# Patient Record
Sex: Female | Born: 1950 | Race: White | Hispanic: No | Smoking: Never smoker
Health system: Southern US, Community
[De-identification: ages and names within clinical notes are randomized; demographics above are authoritative.]

## PROBLEM LIST (undated history)

## (undated) DIAGNOSIS — IMO0002 Reserved for concepts with insufficient information to code with codable children: Secondary | ICD-10-CM

## (undated) DIAGNOSIS — E785 Hyperlipidemia, unspecified: Secondary | ICD-10-CM

## (undated) DIAGNOSIS — N939 Abnormal uterine and vaginal bleeding, unspecified: Secondary | ICD-10-CM

## (undated) HISTORY — DX: Reserved for concepts with insufficient information to code with codable children: IMO0002

## (undated) HISTORY — DX: Abnormal uterine and vaginal bleeding, unspecified: N93.9

## (undated) HISTORY — DX: Hyperlipidemia, unspecified: E78.5

---

## 1987-03-15 HISTORY — PX: DIAGNOSTIC LAPAROSCOPY: SUR761

## 1999-03-15 HISTORY — PX: ANKLE SURGERY: SHX546

## 2008-08-12 ENCOUNTER — Ambulatory Visit (HOSPITAL_COMMUNITY): Admission: RE | Admit: 2008-08-12 | Discharge: 2008-08-12 | Payer: Self-pay | Admitting: Internal Medicine

## 2009-03-14 DIAGNOSIS — IMO0002 Reserved for concepts with insufficient information to code with codable children: Secondary | ICD-10-CM

## 2009-03-14 HISTORY — DX: Reserved for concepts with insufficient information to code with codable children: IMO0002

## 2009-09-23 ENCOUNTER — Other Ambulatory Visit: Admission: RE | Admit: 2009-09-23 | Discharge: 2009-09-23 | Payer: Self-pay | Admitting: Obstetrics and Gynecology

## 2009-09-23 ENCOUNTER — Ambulatory Visit: Payer: Self-pay | Admitting: Obstetrics and Gynecology

## 2009-10-07 ENCOUNTER — Ambulatory Visit: Payer: Self-pay | Admitting: Obstetrics and Gynecology

## 2009-10-08 ENCOUNTER — Ambulatory Visit: Admission: RE | Admit: 2009-10-08 | Discharge: 2009-10-08 | Payer: Self-pay | Admitting: Gynecologic Oncology

## 2009-10-12 HISTORY — PX: ABDOMINAL HYSTERECTOMY: SHX81

## 2009-10-13 ENCOUNTER — Inpatient Hospital Stay (HOSPITAL_COMMUNITY): Admission: RE | Admit: 2009-10-13 | Discharge: 2009-10-16 | Payer: Self-pay | Admitting: Obstetrics & Gynecology

## 2009-10-13 ENCOUNTER — Encounter: Payer: Self-pay | Admitting: Obstetrics & Gynecology

## 2009-11-19 ENCOUNTER — Ambulatory Visit: Admission: RE | Admit: 2009-11-19 | Discharge: 2009-11-19 | Payer: Self-pay | Admitting: Gynecologic Oncology

## 2010-02-24 ENCOUNTER — Ambulatory Visit: Payer: Self-pay | Admitting: Family Medicine

## 2010-05-27 ENCOUNTER — Ambulatory Visit: Payer: Self-pay | Attending: Gynecologic Oncology | Admitting: Gynecologic Oncology

## 2010-05-27 DIAGNOSIS — C549 Malignant neoplasm of corpus uteri, unspecified: Secondary | ICD-10-CM | POA: Insufficient documentation

## 2010-05-27 DIAGNOSIS — E785 Hyperlipidemia, unspecified: Secondary | ICD-10-CM | POA: Insufficient documentation

## 2010-05-28 LAB — COMPREHENSIVE METABOLIC PANEL
ALT: 13 U/L (ref 0–35)
AST: 20 U/L (ref 0–37)
Albumin: 4.2 g/dL (ref 3.5–5.2)
BUN: 14 mg/dL (ref 6–23)
CO2: 30 mEq/L (ref 19–32)
Calcium: 9.8 mg/dL (ref 8.4–10.5)
Chloride: 104 mEq/L (ref 96–112)
Glucose, Bld: 99 mg/dL (ref 70–99)
Potassium: 4.4 mEq/L (ref 3.5–5.1)
Sodium: 142 mEq/L (ref 135–145)
Total Bilirubin: 0.6 mg/dL (ref 0.3–1.2)
Total Protein: 7.2 g/dL (ref 6.0–8.3)

## 2010-05-28 LAB — ABO/RH: ABO/RH(D): A POS

## 2010-05-28 LAB — CBC
HCT: 35.1 % — ABNORMAL LOW (ref 36.0–46.0)
HCT: 42.3 % (ref 36.0–46.0)
Hemoglobin: 11.9 g/dL — ABNORMAL LOW (ref 12.0–15.0)
MCH: 32.7 pg (ref 26.0–34.0)
MCHC: 34.6 g/dL (ref 30.0–36.0)
RBC: 3.63 MIL/uL — ABNORMAL LOW (ref 3.87–5.11)

## 2010-05-28 LAB — BASIC METABOLIC PANEL
BUN: 9 mg/dL (ref 6–23)
Calcium: 8.5 mg/dL (ref 8.4–10.5)
GFR calc Af Amer: >60 mL/min (ref >60)
GFR calc non Af Amer: >60 mL/min (ref >60)
Potassium: 4.6 mEq/L (ref 3.5–5.1)

## 2010-05-28 LAB — DIFFERENTIAL
Basophils Relative: 1 % (ref 0–1)
Eosinophils Relative: 2 % (ref 0–5)
Lymphocytes Relative: 13 % (ref 12–46)
Monocytes Absolute: 0.6 10*3/uL (ref 0.1–1.0)
Neutro Abs: 5.1 10*3/uL (ref 1.7–7.7)
Neutrophils Relative %: 76 % (ref 43–77)

## 2010-05-28 LAB — SURGICAL PCR SCREEN: Staphylococcus aureus: POSITIVE — AB

## 2010-06-04 NOTE — Consult Note (Signed)
Jennifer [name], KRAMME NO.:  [id number]  MEDICAL RECORD NO.:  [id number]           PATIENT TYPE:  LOCATION:                                 FACILITY:  PHYSICIAN:  Laurette Schimke, MD     DATE OF BIRTH:  [birth year]  DATE OF CONSULTATION:  05/27/2010 DATE OF DISCHARGE:                                CONSULTATION   REASON FOR VISIT:  Uterine cancer surveillance.  HISTORY OF PRESENT ILLNESS:  This is a 60 year old who noted vaginal spotting dating back to April 2007.  She sought evaluation and endometrial biopsy in July 2011, was consistent with a grade 3 endometrioid adenocarcinoma.  On October 13, 2009, she underwent total abdominal hysterectomy and bilateral salpingo-oophorectomy, bilateral pelvic, and periaortic lymph node dissection.  Pathology is consistent with a grade 3 endometrioid adenocarcinoma invading less than 50% of the endometrium, deepest invasion was 7 mm, where the myometrium was 18 mm. There is no lymphovascular space invasion and washings were negative. She is a stage IA, grade 3 endometrioid adenocarcinoma. Postoperatively, Ms. Faerber did well. At this visit she denies nausea, vomiting, fever, chills, shortness of breath, cough, headache, increasing abdominal girth, change in appetite, vaginal bleeding, or changes in bowel or urinary habits or lower extremity changes.  PAST MEDICAL HISTORY: 1. Hyperlipidemia diagnosed in April 2011 2. Stage IA grade 3 endometrial cancer.  PAST SURGICAL HISTORY:  Right ankle surgery, 2001; diagnostic laparoscopy, 1989; ear surgery remotely; total abdominal hysterectomy with bilateral salpingo-oophorectomy; bilateral pelvic and periaortic lymph node dissection, August 2011.  FAMILY HISTORY:  No interval changes.  SOCIAL HISTORY:  She is a Administrator, arts at [employer]. She denies tobacco or alcohol use, and she reports that she has multiple bails.  SCREENING HISTORY:  Mammogram in  2010.  REVIEW OF SYSTEMS:  Ten point review of systems as noted above.  PHYSICAL EXAMINATION:  VITAL SIGNS:  Weight 147.5 pounds, blood pressure 122/68, pulse was 68. GENERAL:  Well-developed female, in no acute distress. CHEST:  Clear to auscultation. HEART:  Regular rate and rhythm. BACK:  No CVA tenderness.  There is no cervical, supraclavicular, or inguinal adenopathy. ABDOMEN:  Soft, nontender.  Midline incision well healed. EXTREMITIES:  No clubbing, cyanosis, or edema. PELVIC:  Normal external genitalia, Bartholin's, urethra, and Skene's. Vagina markedly atrophic, cuff intact.  No visible masses.  No nodularity palpable. RECTAL:  Good anal sphincter tone without any masses.  IMPRESSION:  Ms. Kaminsky has a stage IA grade 3 endometrial cancer with no indication for adjuvant therapy.  PLAN:  Plan is for her to follow up with GYN Clinic at [facility] in 3 months and follow up with GYN/Oncology in 6 months.  Pap test will be required no more than annually and imaging based on symptomatology.  All of her questions were answered.     Laurette Schimke, MD     WB/MEDQ  D:  05/27/2010  T:  05/27/2010  Job:  [id number]  cc:   Clement Husbands, M.D.       Fax: [phone]  Telford Nab, R.N. [address] [place], [place] [address]  Electronically Signed by Laurette Schimke MD on 05/31/2010 10:32:21 AM

## 2010-08-23 ENCOUNTER — Ambulatory Visit (INDEPENDENT_AMBULATORY_CARE_PROVIDER_SITE_OTHER): Payer: Self-pay | Admitting: Obstetrics and Gynecology

## 2010-08-23 ENCOUNTER — Other Ambulatory Visit: Payer: Self-pay | Admitting: Obstetrics and Gynecology

## 2010-08-23 DIAGNOSIS — Z01419 Encounter for gynecological examination (general) (routine) without abnormal findings: Secondary | ICD-10-CM

## 2010-08-23 DIAGNOSIS — Z124 Encounter for screening for malignant neoplasm of cervix: Secondary | ICD-10-CM

## 2010-10-28 ENCOUNTER — Ambulatory Visit (HOSPITAL_COMMUNITY)
Admission: RE | Admit: 2010-10-28 | Discharge: 2010-10-28 | Disposition: A | Discharge status: Home / Self Care | Payer: Self-pay | Source: Ambulatory Visit | Attending: Gynecologic Oncology | Admitting: Gynecologic Oncology

## 2010-10-28 ENCOUNTER — Ambulatory Visit: Payer: Self-pay | Attending: Gynecologic Oncology | Admitting: Gynecologic Oncology

## 2010-10-28 DIAGNOSIS — M7989 Other specified soft tissue disorders: Secondary | ICD-10-CM

## 2010-10-28 DIAGNOSIS — R609 Edema, unspecified: Secondary | ICD-10-CM | POA: Insufficient documentation

## 2010-10-28 DIAGNOSIS — E785 Hyperlipidemia, unspecified: Secondary | ICD-10-CM | POA: Insufficient documentation

## 2010-10-28 DIAGNOSIS — C55 Malignant neoplasm of uterus, part unspecified: Secondary | ICD-10-CM | POA: Insufficient documentation

## 2010-11-02 NOTE — Consult Note (Signed)
Jennifer [name], DOWNS NO.:  [id number]  MEDICAL RECORD NO.:  [id number]  LOCATION:                                 FACILITY:  PHYSICIAN:  Laurette Schimke, MD     DATE OF BIRTH:  Mar 09, 1951  DATE OF CONSULTATION:  10/28/2010 DATE OF DISCHARGE:                                CONSULTATION   REASON FOR VISIT:  Uterine cancer surveillance.  HISTORY OF PRESENT ILLNESS:  This is a 60 year old who was evaluated by Dr. Perlie Gold for abnormal uterine bleeding with an endometrial biopsy in July 2011.  Of note, the patient presented with 4 years of abnormal uterine bleeding with endometrial biopsies consistent of grade 3 endometrioid adenocarcinoma.  On October 13, 2009, she underwent total abdominal hysterectomy, bilateral salpingo-oophorectomy, and bilateral pelvic and periaortic lymph node dissection.  Pathology is notable for grade 3 endometrioid carcinoma invading less than 50% of the myometrium. There was no lymphovascular space invasion as such.  She was a stage IA,grade 3 endometrioid adenocarcinoma.  Jennifer [name] has done well since.  PAST MEDICAL HISTORY:  Hyperlipidemia, diagnosed in April 2011 and stage IA, grade 3 endometrial cancer.  PAST SURGICAL HISTORY:  Right ankle surgery in 2001, diagnostic laparoscopy in 1989, remote history of ear surgery, total abdominal hysterectomy, bilateral salpingo-oophorectomy, and bilateral pelvic and periaortic lymph node dissection in August 2011.  SOCIAL HISTORY:  The patient works at [employer].  Denies tobacco or alcohol use.  REVIEW OF SYSTEMS:  No nausea, vomiting, fever, or chills.  No headache, chest pain, or flank pain.  Denies hematuria, hematochezia, Hemoccult, and hematocolpos.  No diarrhea, constipation, change in appetite, or early satiety.  Reports worsening of left lower extremity edema over the past few months.  Otherwise, 10-point review of systems is negative.  PHYSICAL EXAMINATION:  GENERAL:   Well-developed, thin female, in no acute distress. VITAL SIGNS:  Weight 153 pounds, blood pressure 130/64, respiratory rate of 18, and temperature 98.0. LYMPH NODES: No cervical, supraclavicular, or inguinal adenopathy. CHEST:  Clear to auscultation. HEART:  Regular rate and rhythm. BACK:  No CVA tenderness. ABDOMEN:  Soft, obese, and nontender.  Midline incision noted.  No evidence of a hernia. PELVIC:  Normal external genitalia, Bartholin, urethral and Skene. Atrophic vagina.  No masses appreciated in the vagina or cul-de-sac. RECTAL:  Good anal sphincter tone without any rectovaginal septum nodularity. EXTREMITIES:  3+ left lower extremity edema with associated erythema. No Homans' or palpable cords.  DIAGNOSTIC STUDIES:  Left lower extremity Doppler ultrasound was negative for evidence of deep venous thrombosis.  IMPRESSION:  Jennifer [name] is a 60 year old with a stage IA, grade 3 endometrial cancer.  She is without any evidence of disease.  Of issue is the left lower extremity edema.  There is no evidence of venous thrombus.  At this time, we will refer her to Lymphedema Clinic.  It is possible that this may be a confluence of the pelvic lymph node dissection.  Other possibilities are venous stasis, secondary adenopathy within her pelvis, however, this would be more likely to be associated with bilateral lower extremity edema.  Jennifer [name] has been advised to follow up with Dr. Perlie Gold  in 3 months and with GYN/Oncology service in 6 months.     Laurette Schimke, MD     WB/MEDQ  D:  10/28/2010  T:  10/29/2010  Job:  [id number]  cc:   Clement Husbands, M.D. Fax: [phone]  Telford Nab, R.N. [address] [place], [place] [address]  Horton Chin, MD  Electronically Signed by Laurette Schimke MD on 11/02/2010 07:11:18 AM

## 2011-07-02 IMAGING — CR DG CHEST 2V
2 series · 2 of 2 positions shown · non-contrast
Comparison: None

CLINICAL DATA: Preoperative respiratory examination for endometrial
cancer.

CHEST - 2 VIEW

[w chest pa]
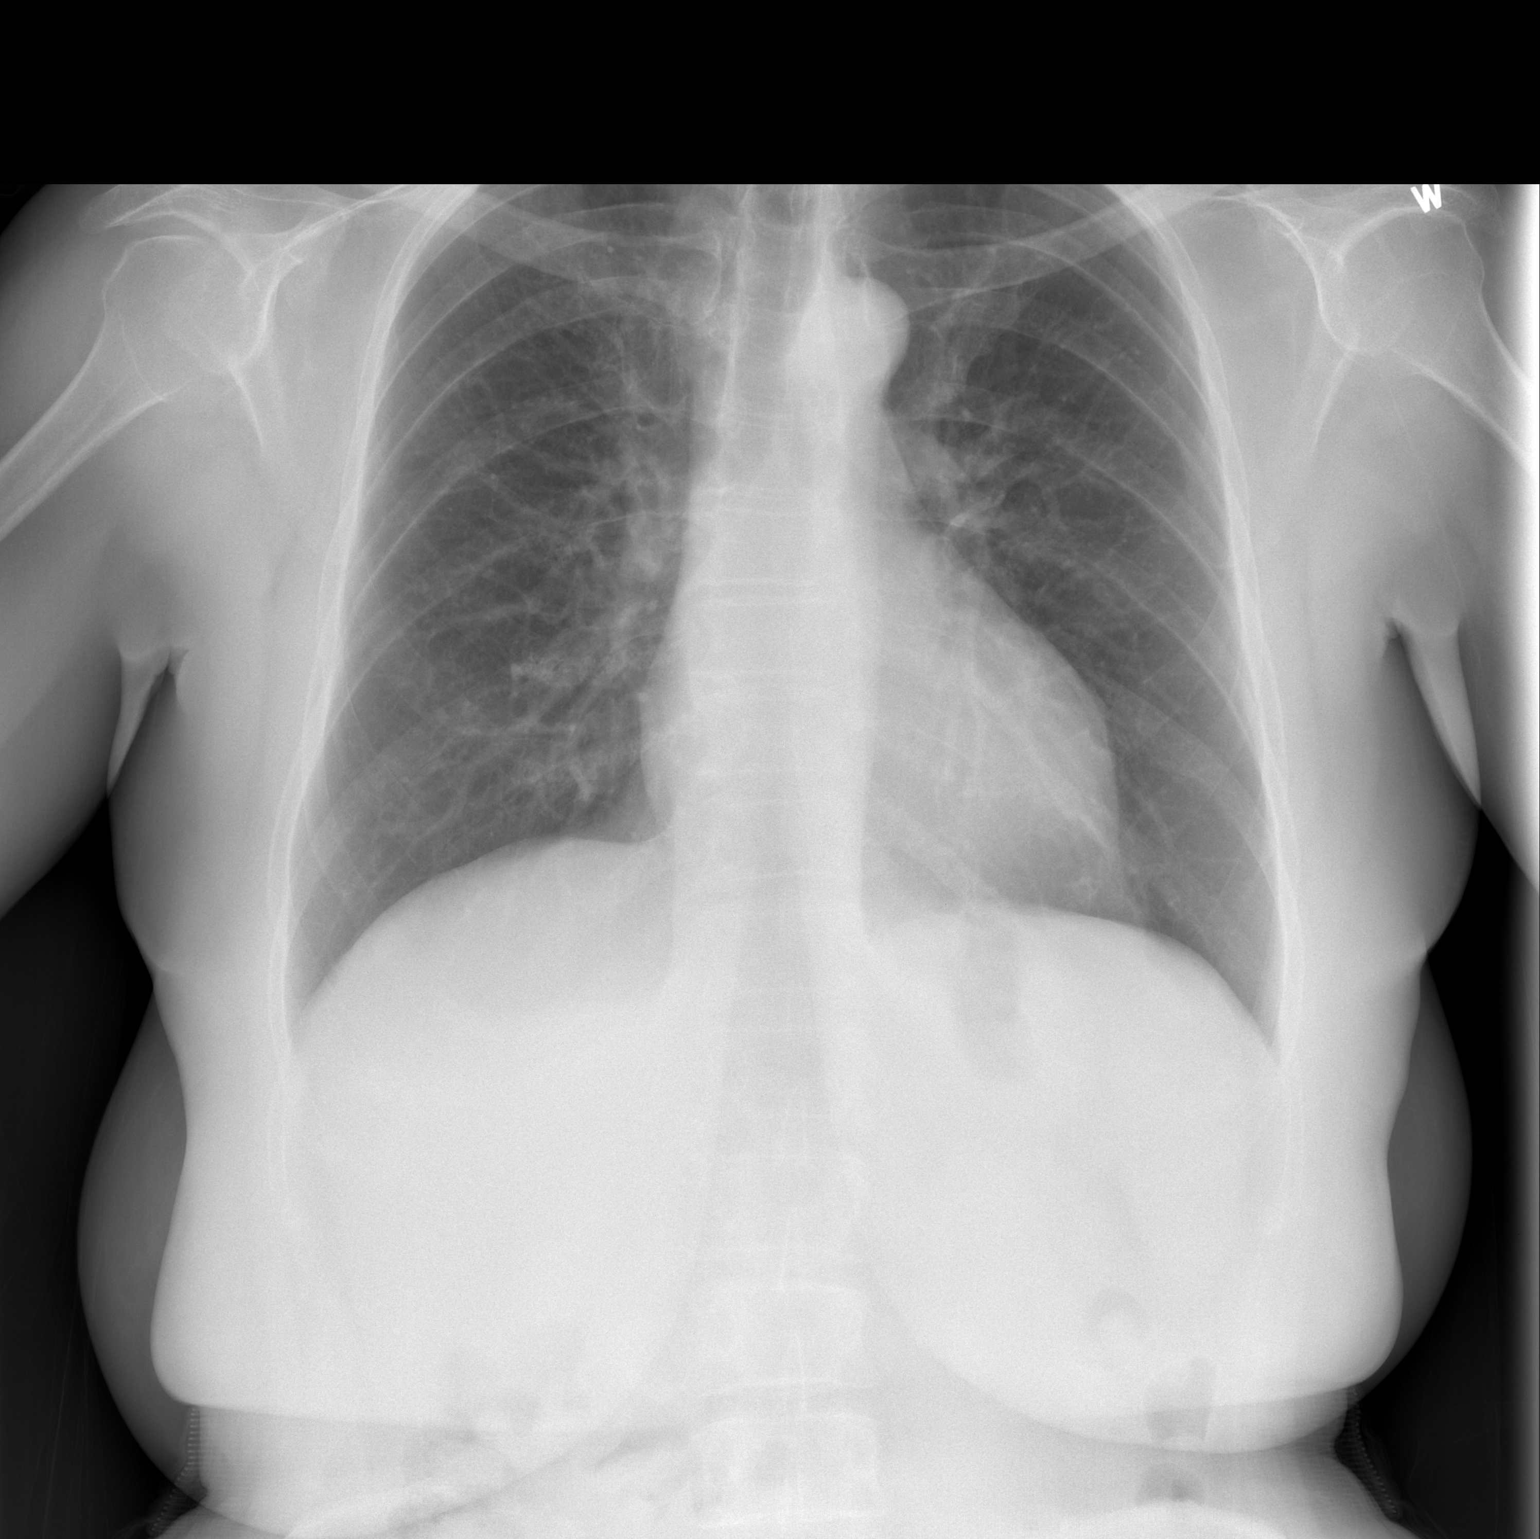

[w chest lat]
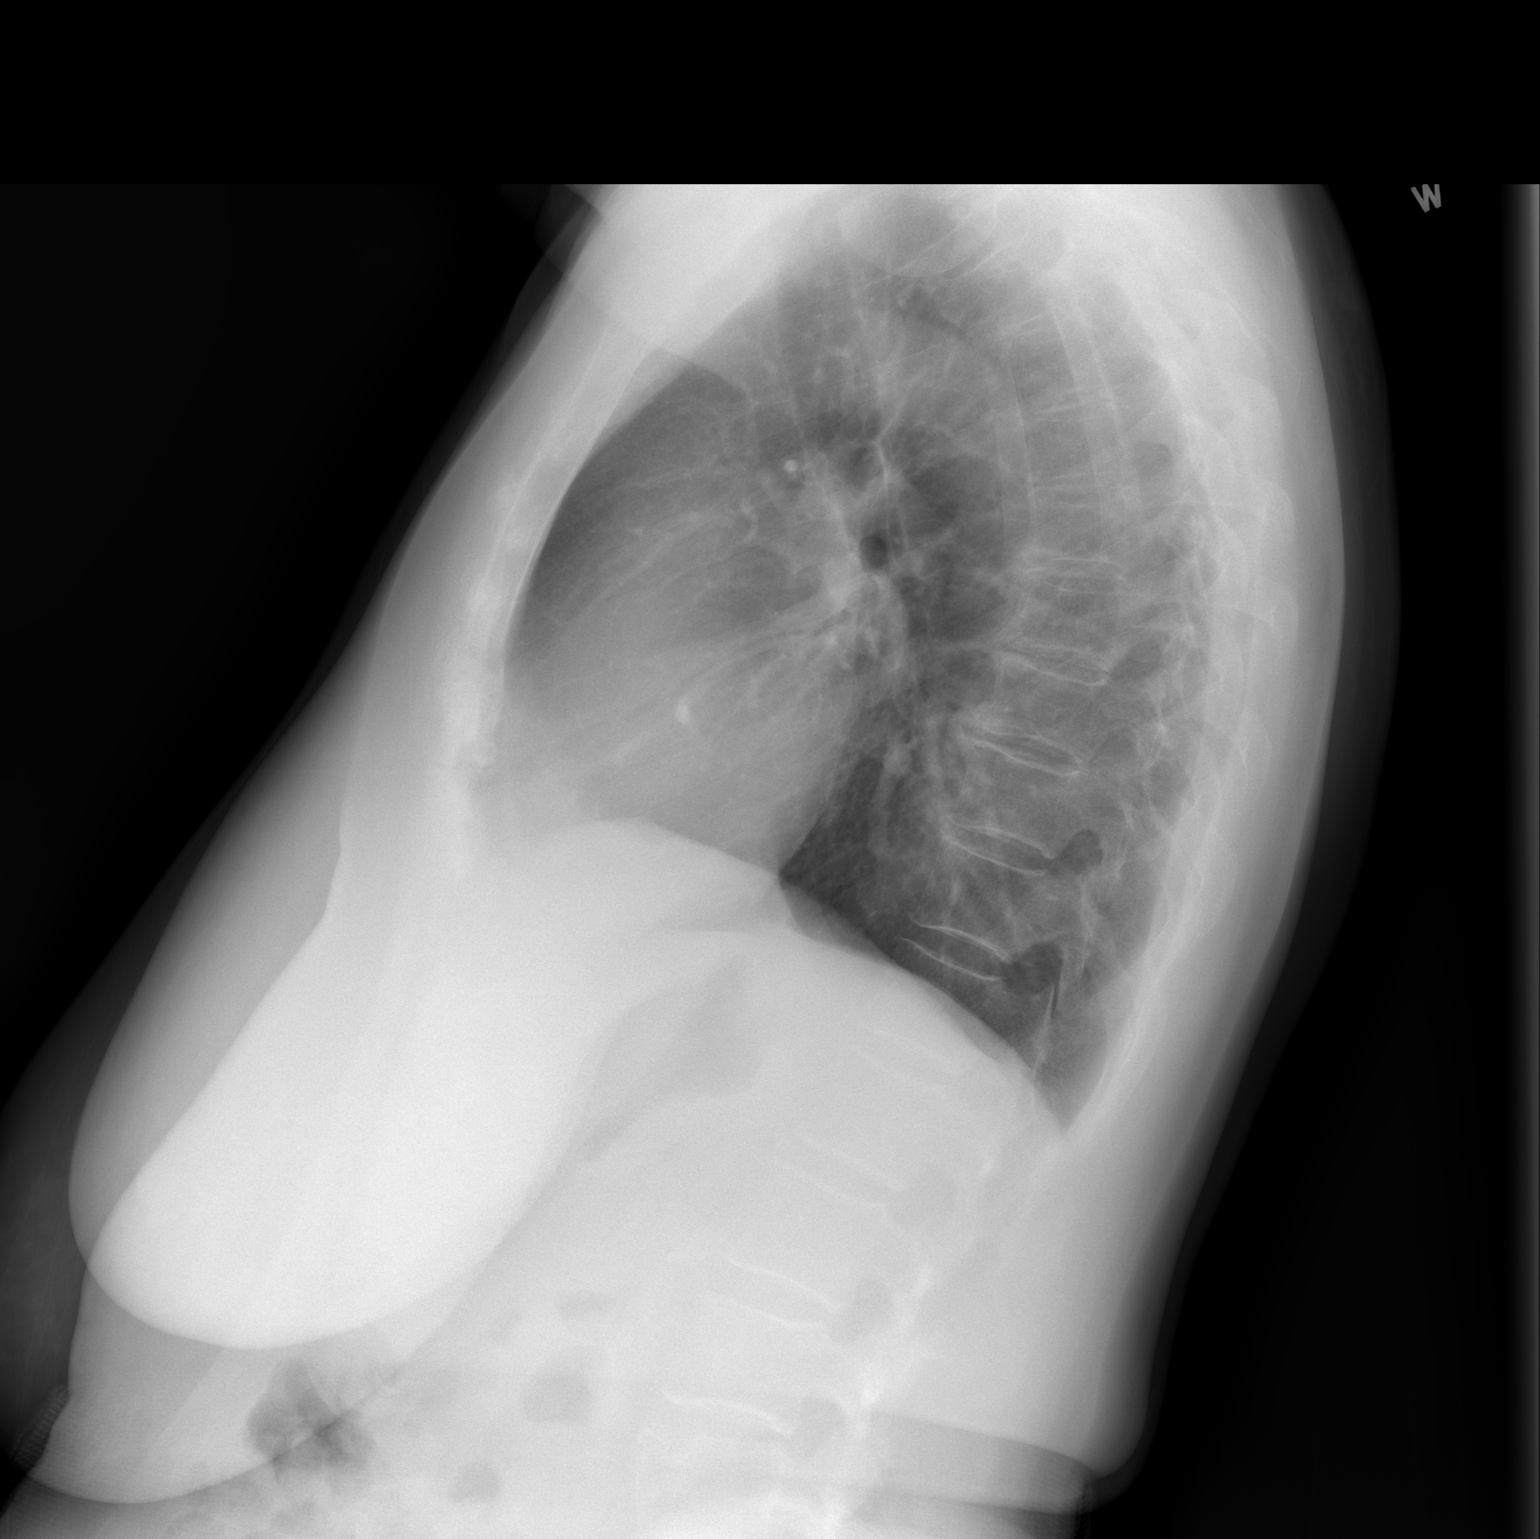

[2 of 2 positions shown; findings below may reference images not displayed]

FINDINGS: The cardiomediastinal silhouette is unremarkable.
The lungs are clear.
There is no evidence of focal airspace disease, pulmonary edema,
pleural effusion, or pneumothorax.
No acute bony abnormalities are identified.
IMPRESSION: No evidence of active cardiopulmonary disease.

## 2011-11-16 ENCOUNTER — Encounter: Payer: Self-pay | Admitting: Gynecologic Oncology

## 2011-11-17 ENCOUNTER — Encounter: Payer: Self-pay | Admitting: Gynecologic Oncology

## 2011-11-17 ENCOUNTER — Other Ambulatory Visit (HOSPITAL_COMMUNITY)
Admission: RE | Admit: 2011-11-17 | Discharge: 2011-11-17 | Disposition: A | Discharge status: Home / Self Care | Payer: Self-pay | Source: Ambulatory Visit | Attending: Gynecologic Oncology | Admitting: Gynecologic Oncology

## 2011-11-17 ENCOUNTER — Ambulatory Visit: Payer: Self-pay | Attending: Gynecologic Oncology | Admitting: Gynecologic Oncology

## 2011-11-17 VITALS — BP 120/80 | HR 66 | Temp 97.6°F | Resp 16 | Ht 61.81 in | Wt 156.4 lb

## 2011-11-17 DIAGNOSIS — C541 Malignant neoplasm of endometrium: Secondary | ICD-10-CM

## 2011-11-17 DIAGNOSIS — Z01419 Encounter for gynecological examination (general) (routine) without abnormal findings: Secondary | ICD-10-CM | POA: Insufficient documentation

## 2011-11-17 DIAGNOSIS — C55 Malignant neoplasm of uterus, part unspecified: Secondary | ICD-10-CM

## 2011-11-17 DIAGNOSIS — C549 Malignant neoplasm of corpus uteri, unspecified: Secondary | ICD-10-CM | POA: Insufficient documentation

## 2011-11-17 DIAGNOSIS — R609 Edema, unspecified: Secondary | ICD-10-CM | POA: Insufficient documentation

## 2011-11-17 NOTE — Patient Instructions (Addendum)
follow up with Gyn Onc in 3 months.  Pap test collected today  Recommend mammography and colonoscopy.

## 2011-11-17 NOTE — Progress Notes (Signed)
REASON FOR VISIT: Uterine cancer surveillance.   HISTORY OF PRESENT ILLNESS: This is a 61 year old who was evaluated by Dr. Perlie Gold for abnormal uterine bleeding with an endometrial biopsy in July 2011. Of note, the patient presented with 4 years of abnormal uterine bleeding with endometrial biopsies consistent of grade 3  endometrioid adenocarcinoma. On October 13, 2009,Dr. Clarke-Pearson performed a  total abdominal hysterectomy, bilateral salpingo-oophorectomy, and bilateral pelvic and periaortic lymph node dissection. Pathology is notable for grade 3 endometrioid carcinoma invading less than 50% of the myometrium.  There was no lymphovascular space invasion as such. She was a stage IA,grade 3 endometrioid adenocarcinoma. Jennifer [name] has done well since.   PAST MEDICAL HISTORY: Hyperlipidemia, diagnosed in April 2011 and stage  IA, grade 3 endometrial cancer.   PAST SURGICAL HISTORY: Right ankle surgery in 2001, diagnostic  laparoscopy in 1989, remote history of ear surgery, total abdominal  hysterectomy, bilateral salpingo-oophorectomy, and bilateral pelvic and  periaortic lymph node dissection in August 2011.   SOCIAL HISTORY: The patient works at [employer]. Denies tobacco  or alcohol use.    REVIEW OF SYSTEMS: No nausea, vomiting, fever, or chills. No headache,  chest pain, or flank pain. Denies hematuria, hematochezia, Hemoccult,  and hematocolpos. No diarrhea, constipation, change in appetite, or  early satiety. Reports stable  left lower extremity edemaOtherwise, 10-point review of systems is negative.   PHYSICAL EXAMINATION: GENERAL: Well-developed, thin female, in no  acute distress.  VITAL SIGNS:BP 120/80  Pulse 66  Temp 97.6 F (36.4 C) (Oral)  Resp 16  Ht 5' 1.81" (1.57 m)  Wt 156 lb 6.4 oz (70.943 kg)  BMI 28.78 kg/m2 LYMPH NODES: No cervical, supraclavicular, or inguinal adenopathy.  CHEST: Clear to auscultation.  HEART: Regular rate and rhythm.  BACK: No CVA  tenderness.  ABDOMEN: Soft, obese, and nontender. Midline incision noted. No  evidence of a hernia.  PELVIC: Normal external genitalia, Bartholin, urethral and Skene.  Atrophic vagina. No masses appreciated in the vagina or cul-de-sac.  RECTAL: Good anal sphincter tone without any rectovaginal septum  nodularity.  EXTREMITIES: 3+ left lower extremity edema with associated erythema.  No Homans' or palpable cords. Right LE wnl  IMPRESSION: Jennifer [name] is a 61 year old with a stage IA, grade 3  endometrial cancer. She is without any evidence of disease.The patient declined referral  to Lymphedema Clinic.The edema is stable and resolves with elevation of the LLE.     Jennifer [name] has been advised to follow up with Gyn Onc in 3 months.  Pap test collected today Recommend mammography and colonoscopy.

## 2011-11-30 ENCOUNTER — Telehealth: Payer: Self-pay | Admitting: *Deleted

## 2011-11-30 NOTE — Telephone Encounter (Signed)
Patient informed of PAP results  

## 2013-01-01 ENCOUNTER — Ambulatory Visit: Payer: BC Managed Care – PPO | Attending: Gynecologic Oncology | Admitting: Gynecologic Oncology

## 2013-01-01 ENCOUNTER — Other Ambulatory Visit (HOSPITAL_COMMUNITY)
Admission: RE | Admit: 2013-01-01 | Discharge: 2013-01-01 | Disposition: A | Discharge status: Home / Self Care | Payer: BC Managed Care – PPO | Source: Ambulatory Visit | Attending: Gynecologic Oncology | Admitting: Gynecologic Oncology

## 2013-01-01 ENCOUNTER — Encounter: Payer: Self-pay | Admitting: Gynecologic Oncology

## 2013-01-01 VITALS — BP 153/84 | HR 108 | Temp 97.9°F | Resp 16 | Ht 61.81 in | Wt 152.0 lb

## 2013-01-01 DIAGNOSIS — C55 Malignant neoplasm of uterus, part unspecified: Secondary | ICD-10-CM

## 2013-01-01 DIAGNOSIS — Z90710 Acquired absence of both cervix and uterus: Secondary | ICD-10-CM | POA: Insufficient documentation

## 2013-01-01 DIAGNOSIS — C549 Malignant neoplasm of corpus uteri, unspecified: Secondary | ICD-10-CM | POA: Insufficient documentation

## 2013-01-01 DIAGNOSIS — Z9079 Acquired absence of other genital organ(s): Secondary | ICD-10-CM | POA: Insufficient documentation

## 2013-01-01 DIAGNOSIS — E785 Hyperlipidemia, unspecified: Secondary | ICD-10-CM | POA: Insufficient documentation

## 2013-01-01 DIAGNOSIS — Z01419 Encounter for gynecological examination (general) (routine) without abnormal findings: Secondary | ICD-10-CM | POA: Insufficient documentation

## 2013-01-01 DIAGNOSIS — R609 Edema, unspecified: Secondary | ICD-10-CM | POA: Insufficient documentation

## 2013-01-01 NOTE — Patient Instructions (Signed)
Pap test collected today F/U in i year No evidence of disease Please f/u with mammogram Colonoscopy recommended.  Mammogram A mammogram is an X-ray test to find changes in a woman's breast. You should get a mammogram if:  You are over 64 years of age.   You have risk factors.   Your doctor recommends that you have one.  BEFORE THE TEST  Do not schedule the test the week before your period, especially if your breasts are sore during this time.  On the day of your mammogram:  Wash your breasts and armpits well. After washing, do not put on any deodorant or talcum powder on until after your test.   Eat and drink as you usually do.   Take your medicines as usual.   If you are diabetic and take insulin, make sure you:   Eat before coming for your test.   Take your insulin as usual.   If you cannot keep your appointment, call before the appointment to cancel. Schedule another appointment.  TEST  You will need to undress from the waist up. You will put on a hospital gown.   Your breast will be put on the mammogram machine, and it will press firmly on your breast with a piece of plastic called a compression paddle. This will make your breast flatter so that the machine can X-ray all parts of your breast.   Both breasts will be X-rayed. Each breast will be X-rayed from above and from the side. An X-ray might need to be taken again if the picture is not good enough.   The mammogram will last about 15 to 30 minutes.  AFTER THE TEST Finding out the results of your test Ask when your test results will be ready. Make sure you get your test results. Document Released: 05/27/2008 Document Revised: 02/17/2011 Document Reviewed: 05/27/2008 [facility] Patient Information 2012 [place], [place].  Colonoscopy A colonoscopy is an exam to evaluate your entire colon. In this exam, your colon is cleansed. A long fiberoptic tube is inserted through your rectum and into your colon. The fiberoptic  scope (endoscope) is a long bundle of enclosed and very flexible fibers. These fibers transmit light to the area examined and send images from that area to your caregiver. Discomfort is usually minimal. You may be given a drug to help you sleep (sedative) during or prior to the procedure. This exam helps to detect lumps (tumors), polyps, inflammation, and areas of bleeding. Your caregiver may also take a small piece of tissue (biopsy) that will be examined under a microscope. LET YOUR CAREGIVER KNOW ABOUT:   Allergies to food or medicine.  Medicines taken, including vitamins, herbs, eyedrops, over-the-counter medicines, and creams.  Use of steroids (by mouth or creams).  Previous problems with anesthetics or numbing medicines.  History of bleeding problems or blood clots.  Previous surgery.  Other health problems, including diabetes and kidney problems.  Possibility of pregnancy, if this applies. BEFORE THE PROCEDURE   A clear liquid diet may be required for 2 days before the exam.  Ask your caregiver about changing or stopping your regular medications.  Liquid injections (enemas) or laxatives may be required.  A large amount of electrolyte solution may be given to you to drink over a short period of time. This solution is used to clean out your colon.  You should be present 60 minutes prior to your procedure or as directed by your caregiver. AFTER THE PROCEDURE   If you received a  sedative or pain relieving medication, you will need to arrange for someone to drive you home.  Occasionally, there is a little blood passed with the first bowel movement. Do not be concerned. FINDING OUT THE RESULTS OF YOUR TEST Not all test results are available during your visit. If your test results are not back during the visit, make an appointment with your caregiver to find out the results. Do not assume everything is normal if you have not heard from your caregiver or the medical facility. It is  important for you to follow up on all of your test results. HOME CARE INSTRUCTIONS   It is not unusual to pass moderate amounts of gas and experience mild abdominal cramping following the procedure. This is due to air being used to inflate your colon during the exam. Walking or a warm pack on your belly (abdomen) may help.  You may resume all normal meals and activities after sedatives and medicines have worn off.  Only take over-the-counter or prescription medicines for pain, discomfort, or fever as directed by your caregiver. Do not use aspirin or blood thinners if a biopsy was taken. Consult your caregiver for medicine usage if biopsies were taken. SEEK IMMEDIATE MEDICAL CARE IF:   You have a fever.  You pass large blood clots or fill a toilet with blood following the procedure. This may also occur 10 to 14 days following the procedure. This is more likely if a biopsy was taken.  You develop abdominal pain that keeps getting worse and cannot be relieved with medicine. Document Released: 02/26/2000 Document Revised: 05/23/2011 Document Reviewed: 10/11/2007 [facility] Patient Information 2014 [place], [place].    Thank you very much Jennifer [name] [facility] for allowing me to provide care for you today.  I appreciate your confidence in choosing our Gynecologic Oncology team.  If you have any questions about your visit today please call our office and we will get back to you as soon as possible.  Maryclare Labrador. Daphanie Oquendo MD., PhD Gynecologic Oncology

## 2013-01-01 NOTE — Progress Notes (Signed)
REASON FOR VISIT: Uterine cancer surveillance.   ASSESSMENT/PLAN: Jennifer [name] is a 62 year old with a stage IA, grade 3  endometrial cancer. She is without any evidence of disease.  [name] has been advised to follow up with Gyn Onc in 12 months. Pap test collected today Recommend mammography and colonoscopy. F/U with PCP as scheduled.   HISTORY OF PRESENT ILLNESS: This is a 62 year old who was evaluated by Dr. Perlie Gold for abnormal uterine bleeding with an endometrial biopsy in July 2011. Of note, the patient presented with 4 years of abnormal uterine bleeding with endometrial biopsies consistent of grade 3 endometrioid adenocarcinoma. On October 13, 2009,Dr. Clarke-Pearson performed a  total abdominal hysterectomy, bilateral salpingo-oophorectomy, and bilateral pelvic and periaortic lymph node dissection. Pathology is notable for grade 3 endometrioid carcinoma invading less than 50% of the myometrium. There was no lymphovascular space invasion as such. She was a stage IA,grade 3 endometrioid adenocarcinoma. [name] has done well since.   PAST MEDICAL HISTORY: Hyperlipidemia, diagnosed in April 2011  Stage IA, grade 3 endometrial cancer.   PAST SURGICAL HISTORY: Right ankle surgery in 2001, diagnostic laparoscopy in 1989, remote history of ear surgery, total abdominal hysterectomy, bilateral salpingo-oophorectomy, and bilateral pelvic and periaortic lymph node dissection in August 2011.   SOCIAL HISTORY: The patient works at [employer]. Denies tobacco or alcohol use.   REVIEW OF SYSTEMS: No nausea, vomiting, fever, or chills. No headache, chest pain, or flank pain. Denies hematuria, hematochezia, Hemoccult, and hematocolpos. No diarrhea, constipation, change in appetite, or early satiety. Reports stable  left lower extremity edemaOtherwise, 10-point review of systems is negative.   PHYSICAL EXAMINATION: GENERAL: Well-developed, thin female, in no acute distress.  VITAL SIGNS:BP  153/84  Pulse 108  Temp(Src) 97.9 F (36.6 C) (Oral)  Resp 16  Ht 5' 1.81" (1.57 m)  Wt 152 lb (68.947 kg)  BMI 27.97 kg/m2 LYMPH NODES: No cervical, supraclavicular, axillary or inguinal adenopathy.  CHEST: Clear to auscultation.  BREAST:  Symmetric. Bilaterally no masses, dimpling, nipple retraction, nipple discharge HEART: Regular rate and rhythm.  BACK: No CVA tenderness.  ABDOMEN: Soft, obese, and nontender. Midline incision noted. No evidence of a hernia.  PELVIC: Normal external genitalia, Bartholin, urethral and Skene. Atrophic vagina. No masses appreciated in the vagina or cul-de-sac. Pap collected RECTAL: Good anal sphincter tone without any rectovaginal septum nodularity.  EXTREMITIES: 3+ left lower extremity edema with associated erythema. No Homans' or palpable cords. Right LE wnl  I

## 2013-01-02 ENCOUNTER — Telehealth: Payer: Self-pay | Admitting: *Deleted

## 2013-01-02 NOTE — Addendum Note (Signed)
Addended by: Warner Mccreedy D on: 01/02/2013 12:25 PM   Modules accepted: Orders

## 2013-01-02 NOTE — Telephone Encounter (Signed)
Called pt to notify her of scheduled appt at [facility] located on [facility]. Appt is scheduled for 01/08/2013 @ 1:30. Pt agreed with this appt and was advised to take insurance information to her appt.

## 2013-01-03 ENCOUNTER — Telehealth: Payer: Self-pay | Admitting: Gynecologic Oncology

## 2013-01-03 NOTE — Telephone Encounter (Signed)
Message left for patient with pap smear results: negative.  Instructed to call for any questions or concerns.  

## 2013-01-08 ENCOUNTER — Ambulatory Visit: Payer: Self-pay | Admitting: Gynecologic Oncology

## 2013-03-29 ENCOUNTER — Ambulatory Visit: Payer: Self-pay | Admitting: Gastroenterology

## 2013-04-02 LAB — PATHOLOGY REPORT

## 2014-01-16 ENCOUNTER — Other Ambulatory Visit (HOSPITAL_COMMUNITY)
Admission: RE | Admit: 2014-01-16 | Discharge: 2014-01-16 | Disposition: A | Discharge status: Home / Self Care | Payer: BC Managed Care – PPO | Source: Ambulatory Visit | Attending: Gynecologic Oncology | Admitting: Gynecologic Oncology

## 2014-01-16 ENCOUNTER — Ambulatory Visit: Payer: BC Managed Care – PPO | Attending: Gynecologic Oncology | Admitting: Gynecologic Oncology

## 2014-01-16 ENCOUNTER — Encounter: Payer: Self-pay | Admitting: Gynecologic Oncology

## 2014-01-16 VITALS — BP 141/69 | HR 101 | Temp 97.8°F | Resp 16 | Ht 61.0 in | Wt 159.2 lb

## 2014-01-16 DIAGNOSIS — C55 Malignant neoplasm of uterus, part unspecified: Secondary | ICD-10-CM

## 2014-01-16 DIAGNOSIS — Z01411 Encounter for gynecological examination (general) (routine) with abnormal findings: Secondary | ICD-10-CM | POA: Insufficient documentation

## 2014-01-16 DIAGNOSIS — Z8542 Personal history of malignant neoplasm of other parts of uterus: Secondary | ICD-10-CM

## 2014-01-16 DIAGNOSIS — C541 Malignant neoplasm of endometrium: Secondary | ICD-10-CM | POA: Insufficient documentation

## 2014-01-16 DIAGNOSIS — E785 Hyperlipidemia, unspecified: Secondary | ICD-10-CM | POA: Insufficient documentation

## 2014-01-16 DIAGNOSIS — R600 Localized edema: Secondary | ICD-10-CM

## 2014-01-16 NOTE — Patient Instructions (Signed)
Will place a referral to lymphedema clinic Will contact you with pap test results Follow-up in 1 year Happy Holidays!  Lymphedema Lymphedema is a swelling caused by the abnormal collection of lymph under the skin. The lymph is fluid from the tissues in your body that travels in the lymphatic system. This system is part of the immune system that includes lymph nodes and vessels. The lymph vessels collect and carry the excess fluid, fats, proteins, and wastes from the tissues of the body to the bloodstream. This system also works to clean and remove bacteria and waste products from the body.  Lymphedema occurs when the lymphatic system is blocked. When the lymph vessels or lymph nodes are blocked or damaged, lymph does not drain properly. This causes abnormal build up of lymph. This leads to swelling in the arms or legs. Lymphedema cannot be cured by medicines. But the swelling can be reduced by physical methods. CAUSES  There are two types of lymphedema. Primary lymphedema is caused by the absence or abnormality of the lymph vessel at birth. It is also known as inherited lymphedema, which occurs rarely. Secondary or acquired lymphedema occurs when the lymph vessel is damaged or blocked. The causes of lymph vessel blockage are:   Skin infection like cellulites.  Infection by parasites (filariasis).  Injury.  Cancer.  Radiation therapy.  Formation of scar tissue.  Surgery. SYMPTOMS  The symptoms of lymphedema are:  Abnormal swelling of the arm or leg.  Heavy or tight feeling in your arm or leg.  Tight-fitting shoes or rings.  Redness of skin over the affected area.  Limited movement of the affected limb.  Some patients complain about sensitivity to touch and discomfort in the limb(s) affected. You may not have these symptoms immediately following injury. They usually appear within a few days or even years after injury. Inform your caregiver, if you have any of these symptoms. Early  treatment can avoid further problems.  DIAGNOSIS  First, your caregiver will inquire about any surgery you have had or medicines you are taking. He will then examine you. Your caregiver may order special imaging tests, such as:  Lymphoscintigraphy (a test in which a low dose of radioactive substance is injected to trace the flow of lymph through the lymph vessels).  MRI (imaging tests using magnetic fields).  Computed tomography (test using special cross-sectional X-rays).  Duplex ultrasound (test using high-frequency sound waves to show the vessels and the blood flow on a screen).  Lymphangiography (special X-ray taken after injecting a contrast dye into the lymph vessel). It is now rarely done. TREATMENT  Lymphedema can be treated in different ways. Your caregiver will decide the type of treatment depending on the cause. Treatment may include:  Exercise: Special exercises will help fluid move out easily from the affected part. This should be done as per your caregiver's advice.  Manual lymph drainage: Gentle massage of the affected limb makes the fluid to move out more freely.  Compression: Compression stockings or external pump apply pressure over the affected limb. This helps the fluid to move out from the arm or leg. Bandaging can also help to move the fluid out from the affected part. Your caregiver will decide the method that suits you the best.  Medicines: Your caregiver may prescribe antibiotics, if you have infection.  Surgery: Your caregiver may advise surgery for severe lymphedema. It is reserved for special cases when the patient has difficulty moving. Your surgeon may remove excess tissue from the arm  or leg. This will help to ease your movement. Physical therapy may have to be continued after surgery. HOME CARE INSTRUCTIONS  The area is very fragile and is predisposed to injury and infection.  Eat a healthy diet.  Exercise regularly as per advice.  Keep the affected  area clean and dry.  Use gloves while cooking or gardening.  Protect your skin from cuts.  Use electric razor to shave the affected area.  Keep affected limb elevated.  Do not wear tight clothes, shoes, or jewelry as it may cause the tissue to be strangled.  Do not use heat pads over the affected area.  Do not sit with cross legs.  Do not walk barefoot.  Do not carry weight on the affected arm.  Avoid having blood pressure checked on the affected limb. SEEK MEDICAL CARE IF:  You continue to have swelling in your limb. SEEK IMMEDIATE MEDICAL CARE IF:   You have high fever.  You have skin rash.  You have chills or sweats.  You have pain or redness.  You have a cut that does not heal. MAKE SURE YOU:   Understand these instructions.  Will watch your condition.  Will get help right away if you are not doing well or get worse. Document Released: 12/26/2006 Document Revised: 02/15/2012 Document Reviewed: 12/01/2008 [facility] Patient Information 2015 [place], [place]. This information is not intended to replace advice given to you by your health care provider. Make sure you discuss any questions you have with your health care provider.

## 2014-01-16 NOTE — Addendum Note (Signed)
Addended by: Joylene John D on: 01/16/2014 04:17 PM   Modules accepted: Orders

## 2014-01-16 NOTE — Addendum Note (Signed)
Addended by: Lucile Crater on: 01/16/2014 04:22 PM   Modules accepted: Orders

## 2014-01-16 NOTE — Progress Notes (Signed)
REASON FOR VISIT: Uterine cancer surveillance.   ASSESSMENT/PLAN: Jennifer [name] is a 63 year old with a stage IA, grade 3  endometrial cancer. She is without any evidence of disease.  Jennifer [name] has been advised to follow up with Gyn Onc in 12 months. Pap test collected today F/U with PCP as scheduled.  Lymphedema: [name] now has insurance and will accept referral to lymphedema clinic Advised to elevate her LLE as much as possible   HISTORY OF PRESENT ILLNESS: This is a 63 year old who was evaluated by Dr. Joneen [name] for abnormal uterine bleeding with an endometrial biopsy in July 2011. Of note, Jennifer [name] presented with 4 years of abnormal uterine bleeding with endometrial biopsies consistent of grade 3 endometrioid adenocarcinoma. On October 13, 2009,Dr. Clarke-Pearson performed a  total abdominal hysterectomy, bilateral salpingo-oophorectomy, and bilateral pelvic and periaortic lymph node dissection. Pathology is notable for grade 3 endometrioid carcinoma invading less than 50% of Jennifer myometrium. There was no lymphovascular space invasion as such. She was a stage IA,grade 3 endometrioid adenocarcinoma. Jennifer [name] has done well since.   PAST MEDICAL HISTORY: Hyperlipidemia, diagnosed in April 2011  Stage IA, grade 3 endometrial cancer.   PAST SURGICAL HISTORY: Right ankle surgery in 2001, diagnostic laparoscopy in 1989, remote history of ear surgery, total abdominal hysterectomy, bilateral salpingo-oophorectomy, and bilateral pelvic and periaortic lymph node dissection in August 2011.   SOCIAL HISTORY: Jennifer [name] works at [employer]. Denies tobacco or alcohol use. Has insurance now  REVIEW OF SYSTEMS: No nausea, vomiting, fever, or chills. No headache, chest pain, or flank pain. Denies hematuria, hematochezia, Hemoccult, and hematocolpos. No diarrhea, constipation, change in appetite, or early satiety. Reports worsening left lower extremity edema.  Otherwise, 10-point review of systems  is negative.   PHYSICAL EXAMINATION: GENERAL: Well-developed, thin female, in no acute distress.  VITAL SIGNS:BP 141/69 mmHg  Pulse 101  Temp(Src) 97.8 F (36.6 C) (Oral)  Resp 16  Ht 5\' 1"  (1.549 m)  Wt 159 lb 3.2 oz (72.213 kg)  BMI 30.10 kg/m2  Wt Readings from Last 3 Encounters:  01/16/14 159 lb 3.2 oz (72.213 kg)  01/01/13 152 lb (68.947 kg)  11/17/11 156 lb 6.4 oz (70.943 kg)   LYMPH NODES: No cervical, supraclavicular, axillary or inguinal adenopathy.  CHEST: Clear to auscultation.  HEART: Regular rate and rhythm.  BACK: No CVA tenderness.  ABDOMEN: Soft, obese, and nontender. Midline incision noted. No evidence of a hernia.  PELVIC: Normal external genitalia, Bartholin, urethral and Skene. Atrophic vagina. No masses appreciated in Jennifer vagina or cul-de-sac. Pap collected RECTAL: Good anal sphincter tone without any rectovaginal septum nodularity.  EXTREMITIES: 3+ left lower extremity edema with associated erythema. No Homans' or palpable cords. Right LE wnl  I

## 2014-01-22 LAB — CYTOLOGY - PAP

## 2014-01-24 ENCOUNTER — Telehealth: Payer: Self-pay | Admitting: *Deleted

## 2014-01-24 NOTE — Telephone Encounter (Signed)
-----   Message from Dorothyann Gibbs, NP sent at 01/23/2014  1:41 PM EST ----- Please let her know that her pap smear is normal.  Thanks  ----- Message -----    From: Lab in [facility] [place] [facility] Interface    Sent: 01/22/2014   4:44 PM      To: Dorothyann Gibbs, NP

## 2014-01-24 NOTE — Telephone Encounter (Signed)
Called pt, lmovm, pap smear results were normal. Pt to call office with any concerns.

## 2014-01-29 ENCOUNTER — Ambulatory Visit: Payer: BC Managed Care – PPO | Attending: Gynecologic Oncology | Admitting: Physical Therapy

## 2014-01-29 DIAGNOSIS — C541 Malignant neoplasm of endometrium: Secondary | ICD-10-CM | POA: Diagnosis not present

## 2014-01-29 DIAGNOSIS — Z5189 Encounter for other specified aftercare: Secondary | ICD-10-CM | POA: Insufficient documentation

## 2014-01-29 DIAGNOSIS — I890 Lymphedema, not elsewhere classified: Secondary | ICD-10-CM | POA: Diagnosis not present

## 2014-01-29 NOTE — Therapy (Signed)
Physical Therapy Evaluation  Patient Details  Name: Jennifer [name] MRN: [id number] Date of Birth: [birth year]  Encounter Date: 01/29/2014      PT End of Session - 01/29/14 1442    Visit Number 1   Number of Visits 8   Date for PT Re-Evaluation 03/13/14   PT Start Time 1350   PT Stop Time 1435   PT Time Calculation (min) 45 min   Behavior During Therapy [facility] for tasks assessed/performed      Past Medical History  Diagnosis Date  . Abnormal uterine bleeding   . 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 Hyperlipidemia     Past Surgical History  Procedure Laterality Date  . Ankle surgery  2001    Right  . Diagnostic laparoscopy  1989  . Abdominal hysterectomy  10/2009    TAH, BSO, bilateral pelvic and periaortic lymph node dissection    There were no vitals taken for this visit.  Visit Diagnosis:  Lymphedema of both lower extremities - Plan: PT plan of care cert/re-cert      Subjective Assessment - 01/29/14 1356    Symptoms dr Skeet Latch sent me here.  pt reports swelling in lower leg. less when she first gets up in the morning then gets worse tnrougout the day   Currently in Pain? No/denies          [facility] PT Assessment - 01/29/14 1358    Assessment   Medical Diagnosis lymphedema   Onset Date 05/04/13   Precautions   Precautions Other (comment)   Precaution Comments cancer with abdominal surgery   Restrictions   Weight Bearing Restrictions No   Balance Screen   Has the patient fallen in the past 6 months No   Has the patient had a decrease in activity level because of a fear of falling?  No   Is the patient reluctant to leave their home because of a fear of falling?  No   Home Environment   Living Enviornment Private residence   Living Arrangements Children   Available Help at Discharge Family   Type of [place] Access Level entry   [place] One level   Prior Function   Level of Independence Independent with basic ADLs;Independent  with homemaking with ambulation;Independent with gait;Independent with transfers   Vocation Part time employment   Vocation Requirements has to be up on her on her feet   Leisure bowl   Cognition   Overall Cognitive Status Within Functional Limits for tasks assessed           Plan - 01/29/14 1442    Clinical Impression Statement Pt presentsl with pitting lymphedema in left leg of about 3 years duration and possible developing lymphedema in right leg Pt was issued tg soft for left leg to see if light compession will make a difference in her leg   Pt will benefit from skilled therapeutic intervention in order to improve on the following deficits Increased edema;Decreased skin integrity   Rehab Potential Excellent   PT Frequency 2x / week   PT Duration 4 weeks   PT Treatment/Interventions Manual lymph drainage;Manual techniques;Therapeutic exercise;Patient/family education   PT Next Visit Plan manual lymph drainage with instruction in self care, possibly sending home video towatch. consider compression bandaging and investigate bandaging alternatives instruct in exercise   Consulted and Agree with Plan of Care Patient        Problem List Patient Active Problem List  Diagnosis Date Noted  . Endometrial cancer 11/17/2011            LYMPHEDEMA/ONCOLOGY QUESTIONNAIRE - 01/29/14 1410    Type   Cancer Type endometrial cancer   Surgeries   Other Surgery Date 10/12/09   Number Lymph Nodes Removed --  cant remember   Date Lymphedema/Swelling Started   Date 03/02/14   Treatment   Past Chemotherapy Treatment No   Past Radiation Treatment No   Past Hormone Therapy No   What other symptoms do you have   Are you Having Heaviness or Tightness Yes   Are you having Pain No   Are you having pitting edema Yes   Body Site left lower leg   Is it Hard or Difficult finding clothes that fit No   Do you have infections No   Is there Decreased scar mobility No   Stemmer Sign No    Other Symptoms pitting noted around top of sock on right leg   Lymphedema Stage   Stage STAGE 2 SPONTANEOUSLY IRREVERSIBLE   Lymphedema Assessments   Lymphedema Assessments Lower extremities   Right Lower Extremity Lymphedema   10 cm Proximal to Suprapatella 44 cm   At Midpatella/Popliteal Crease 37 cm   30 cm Proximal to Floor at Lateral Plantar Foot 33 cm   20 cm Proximal to Floor at Lateral Plantar Foot 25 1   10  cm Proximal to Floor at Lateral Malleoli 21.3 cm   5 cm Proximal to Floor to 1st MTP Joint 20.8 cm   Across MTP Joint 19.4 cm   Around Proximal Great Toe 7.5 cm   Left Lower Extremity Lymphedema   10 cm Proximal to Suprapatella 46 cm   At Midpatella/Popliteal Crease 39 cm   30 cm Proximal to Floor at Lateral Plantar Foot 38.3 cm   20 cm Proximal to Floor at Lateral Plantar Foot 34 cm   10 cm Proximal to Floor at Lateral Malleoli 25.2 cm   5 cm Proximal to Floor to 1st MTP Joint 22.2 cm   Across MTP Joint 21.3 cm   Around Proximal Great Toe 7.3 cm   Other diffuse light red rash  about 10cm wide around leg at 10 cm prox. to the floor             Short Term Clinic Goals - 01/29/14 1450    CC Short Term Goal  #1   Title short term goals= long term goals          Long Term Clinic Goals - 01/29/14 1451    CC Long Term Goal  #1   Title pt wil verbalize understanding of lymphedema risk reduction practices   Time 6   Period Weeks   Status New   CC Long Term Goal  #2   Title pt will be able to perform self manual lymph drainage   Time 6   Period Weeks   Status New   CC Long Term Goal  #3   Title pt will verbalize use of compression for edema management   Time 6   Period Weeks   Status New   CC Long Term Goal  #4   Title pt will be independent in a home exercise program for lymphedema management   Time 6   Period Weeks   Status New   CC Long Term Goal  #5   Title pt will have a 5 cm reduction in left leg at 20 cm proximal to the floor at  lateral leg    Time 6   Period Weeks   Status New     Teresa K. Owens Shark, PT  01/29/2014, 2:57 PM

## 2014-02-03 ENCOUNTER — Ambulatory Visit: Payer: BC Managed Care – PPO | Admitting: Physical Therapy

## 2014-02-03 DIAGNOSIS — I890 Lymphedema, not elsewhere classified: Secondary | ICD-10-CM

## 2014-02-03 DIAGNOSIS — Z5189 Encounter for other specified aftercare: Secondary | ICD-10-CM | POA: Diagnosis not present

## 2014-02-03 NOTE — Therapy (Signed)
Physical Therapy Treatment  Patient Details  Name: Avira Tillison MRN: [id number] Date of Birth: [birth year]  Encounter Date: 02/03/2014      PT End of Session - 02/03/14 1505    Visit Number 2   Number of Visits 8   Date for PT Re-Evaluation 03/13/14   PT Start Time 0034   PT Stop Time 1435   PT Time Calculation (min) 50 min      Past Medical History  Diagnosis Date  . Abnormal uterine bleeding   . 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 Hyperlipidemia     Past Surgical History  Procedure Laterality Date  . Ankle surgery  2001    Right  . Diagnostic laparoscopy  1989  . Abdominal hysterectomy  10/2009    TAH, BSO, bilateral pelvic and periaortic lymph node dissection    There were no vitals taken for this visit.  Visit Diagnosis:  Lymphedema of both lower extremities      Subjective Assessment - 02/03/14 1458    Symptoms pt has been wearing tg soft on her leg and states if feels really good.  She is not able to financially afford a custom compression stocking, but will look around to see if she can find an off the shelf one that will help her.  She is interested in a compression bandage below her knee that she can still wear her shoe with and wants to learn self manual lymph drainage.   Currently in Pain? No/denies            [facility] Adult PT Treatment/Exercise - 02/03/14 1504    Knee/Hip Exercises: Supine   Other Supine Knee Exercises knee to chest   Manual Therapy   Edema Management manual lymph drainage; short neck, superficial and deep abdominals, left axiila, left inguino-axillary anastamosis,right inguinal node, anterior inter-inguino anasatomosis. left lateral upper leg, knee, lower leg and foot and return along pathways.    Compression Bandaging biotone lotion, thick stockinette to heel and leg, artiflex and 3 short stretch bandages from heel to below knee.  foot able to fit in shoe after treatment   Ankle Exercises: Supine   Other Supine  Ankle Exercises ankle pumps                Plan - 02/03/14 1506    Clinical Impression Statement Leg appears to be less pitting after wearing tg soft.  She tolerated intitial manual lymph drainage session and bandaging .  She will likely  be able to progress to an off the shelf stocking soon. Pt borrowed Web designer DVD and will practice on her right (unwrapped) leg  that has minor swelling.  She received and Fish farm manager to look for prices on stockings   PT Next Visit Plan manual lymph drainage with instruction in self care, bandaging. home exercise program        Problem List Patient Active Problem List   Diagnosis Date Noted  . Endometrial cancer 11/17/2011    Donato Heinz. Owens Shark, PT  Norwood Levo 02/03/2014, 3:26 PM

## 2014-02-05 ENCOUNTER — Ambulatory Visit: Payer: BC Managed Care – PPO | Admitting: Physical Therapy

## 2014-02-05 DIAGNOSIS — I890 Lymphedema, not elsewhere classified: Secondary | ICD-10-CM

## 2014-02-05 DIAGNOSIS — Z5189 Encounter for other specified aftercare: Secondary | ICD-10-CM | POA: Diagnosis not present

## 2014-02-05 NOTE — Therapy (Signed)
Physical Therapy Treatment  Patient Details  Name: Jennifer [name] MRN: [id number] Date of Birth: December 02, 1950  Encounter Date: 02/05/2014      PT End of Session - 02/05/14 1224    Visit Number 3   Number of Visits 8   Date for PT Re-Evaluation 03/13/14   PT Start Time 1100   PT Stop Time 1145   PT Time Calculation (min) 45 min      Past Medical History  Diagnosis Date  . Abnormal uterine bleeding   . 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 Hyperlipidemia     Past Surgical History  Procedure Laterality Date  . Ankle surgery  2001    Right  . Diagnostic laparoscopy  1989  . Abdominal hysterectomy  10/2009    TAH, BSO, bilateral pelvic and periaortic lymph node dissection    There were no vitals taken for this visit.  Visit Diagnosis:  Lymphedema of both lower extremities      Subjective Assessment - 02/05/14 1219    Symptoms pt states that the bandage got looser . it felt good on her leg while she was working. she was able to get keep her shoe on without difficutly   Currently in Pain? No/denies            [facility] Adult PT Treatment/Exercise - 02/05/14 1221    Exercises   Exercises Ankle;Knee/Hip   Knee/Hip Exercises: Supine   Other Supine Knee Exercises active knee to chest    Ankle Exercises: Supine   Other Supine Ankle Exercises ankel pumps and active range of motion     Treatment : Manual lymph drainage in supine: short neck, left axillary nodes, superficial and deep abdominals, left inguino-axillary anastamoses, left lower extremity from toes and dorsal foot to lateral hip redirecting along pathway.       Plan - 02/05/14 1224    Clinical Impression Statement Great reduction with comoression bandaging. Pt has not had time yet to watch the [facility] DVD or get compression stockings. She will take these wraps off Friday or Saturday and try to wear a knee high sock or Tg Soft at home.  She feels like she will have time to get her compression  stocking and watch the DVD next week.. Should be able discharge from treatment once she has these in place and is able to do self manual lymph drainage   PT Next Visit Plan progress with self manual lymph drainage and home exercise program        Problem List Patient Active Problem List   Diagnosis Date Noted  . Endometrial cancer 11/17/2011            LYMPHEDEMA/ONCOLOGY QUESTIONNAIRE - 02/05/14 1221    Left Lower Extremity Lymphedema   At Midpatella/Popliteal Crease 39 cm   30 cm Proximal to Floor at Lateral Plantar Foot 36.6 cm   20 cm Proximal to Floor at Lateral Plantar Foot 27.8 cm   10 cm Proximal to Floor at Lateral Malleoli 21.7 cm   5 cm Proximal to Floor to 1st MTP Joint 22 cm   Across MTP Joint 20.9 cm   Around Proximal Great Toe 7.3 cm            Long Term Clinic Goals - 02/05/14 1229    CC Long Term Goal  #1   Title pt wil verbalize understanding of lymphedema risk reduction practices   Time 6   Period Weeks   Status On-going  CC Long Term Goal  #2   Title pt will be able to perform self manual lymph drainage   Time 6   Period Weeks   Status On-going   CC Long Term Goal  #3   Title pt will verbalize use of compression for edema management   Time 6   Period Weeks   Status On-going   CC Long Term Goal  #4   Title pt will be independent in a home exercise program for lymphedema management   Time 6   Period Weeks   Status On-going   CC Long Term Goal  #5   Title pt will have a 5 cm reduction in left leg at 20 cm proximal to the floor at lateral leg   Time 6   Period Weeks   Status Achieved  pt has 6.2 cm reduction after compression bandaging         Donato Heinz. Owens Shark, PT    Norwood Levo 02/05/2014, 12:33 PM

## 2014-02-13 ENCOUNTER — Ambulatory Visit: Payer: BC Managed Care – PPO | Admitting: Physical Therapy

## 2014-02-17 ENCOUNTER — Ambulatory Visit: Payer: BC Managed Care – PPO | Admitting: Physical Therapy

## 2014-02-19 ENCOUNTER — Ambulatory Visit: Payer: BC Managed Care – PPO | Attending: Gynecologic Oncology

## 2014-02-19 DIAGNOSIS — I890 Lymphedema, not elsewhere classified: Secondary | ICD-10-CM

## 2014-02-19 DIAGNOSIS — C541 Malignant neoplasm of endometrium: Secondary | ICD-10-CM | POA: Insufficient documentation

## 2014-02-19 DIAGNOSIS — Z5189 Encounter for other specified aftercare: Secondary | ICD-10-CM | POA: Diagnosis not present

## 2014-02-19 NOTE — Therapy (Signed)
[place], [place], [address] Phone: [phone]   Fax:  [phone]  Physical Therapy Treatment  Patient Details  Name: Jennifer [name] MRN: [id number] Date of Birth: 1950/05/14  Encounter Date: 02/19/2014      PT End of Session - 02/19/14 1609    Visit Number 4   Number of Visits 8   Date for PT Re-Evaluation 03/13/14   PT Start Time 1525   PT Stop Time 1608   PT Time Calculation (min) 43 min      Past Medical History  Diagnosis Date  . Abnormal uterine bleeding   . 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 Hyperlipidemia     Past Surgical History  Procedure Laterality Date  . Ankle surgery  2001    Right  . Diagnostic laparoscopy  1989  . Abdominal hysterectomy  10/2009    TAH, BSO, bilateral pelvic and periaortic lymph node dissection    There were no vitals taken for this visit.  Visit Diagnosis:  Lymphedema of both lower extremities      Subjective Assessment - 02/19/14 1527    Symptoms Started watching the video but havent finished it. Had to take the bandages off Friday morning at 0130 after last Wed appt due to bandages feeling too tight. Had compression stockings I didnt know about that I put on after removing bandages that felt good on lower leg, but were too tight above the knee.            [facility] Adult PT Treatment/Exercise - 02/19/14 0001    Manual Therapy   Manual Lymphatic Drainage (MLD) In supine: Short neck, superficial and deep abdominals, Lt axilla nodes and Lt inguino-axilla anastomosis, Lt LE from dorsal foot to lateral thigh.   Compression Bandaging Biotone lotion, thick stockinette, Artiflex and 2 short stretch compression bandages from heel to knee.                Plan - 02/19/14 1612    Clinical Impression Statement Pt had noted great reductions after unwrapping from last time, but was unable to keep bandages on for too long after last treatment.  Instructed pt today on how to rewrap leg prn between appointments and she seems to have a good understanding of this and will call Hoyle Sauer when her leg has reduced again. Was increased today due to not being wrapped for a few days.   Pt will benefit from skilled therapeutic intervention in order to improve on the following deficits Increased edema;Decreased skin integrity   Rehab Potential Excellent   PT Frequency 2x / week   PT Duration 4 weeks   PT Treatment/Interventions Manual lymph drainage;Manual techniques;Therapeutic exercise;Patient/family education   PT Next Visit Plan progress with self manual lymph drainage and home exercise program   Consulted and Agree with Plan of Care Patient              LYMPHEDEMA/ONCOLOGY QUESTIONNAIRE - 02/19/14 1554    Left Lower Extremity Lymphedema   10 cm Proximal to Suprapatella 44 cm   At Midpatella/Popliteal Crease 39.5 cm   30 cm Proximal to Floor at Lateral Plantar Foot 37.5 cm   20 cm Proximal to Floor at Lateral Plantar Foot 31.5 cm   10 cm Proximal to Floor at Lateral Malleoli 25.7 cm   5 cm Proximal to Floor to 1st MTP Joint 20.8 cm   Across MTP Joint 20.9 cm   Around Proximal Great Toe  7.8 cm                        Long Term Clinic Goals - 02/19/14 1614    CC Long Term Goal  #1   Title pt wil verbalize understanding of lymphedema risk reduction practices   Time 6   Period Weeks   Status Achieved   CC Long Term Goal  #2   Title pt will be able to perform self manual lymph drainage   Time 6   Period Weeks   Status On-going   CC Long Term Goal  #3   Title pt will verbalize use of compression for edema management   Time 6   Period Weeks   Status Achieved   CC Long Term Goal  #4   Title pt will be independent in a home exercise program for lymphedema management   Time 6   Period Weeks   Status On-going         Problem List Patient Active Problem List   Diagnosis Date Noted  . Endometrial cancer  11/17/2011    Otelia Limes, PTA 02/19/2014, 4:15 PM

## 2014-03-10 ENCOUNTER — Ambulatory Visit: Payer: BC Managed Care – PPO | Admitting: Physical Therapy

## 2014-03-12 ENCOUNTER — Ambulatory Visit: Payer: BC Managed Care – PPO

## 2014-03-12 DIAGNOSIS — I890 Lymphedema, not elsewhere classified: Secondary | ICD-10-CM

## 2014-03-12 NOTE — Therapy (Signed)
[place], [place], [address] Phone: [phone]   Fax:  [phone]  Physical Therapy Treatment  Patient Details  Name: Jennifer [name] MRN: [id number] Date of Birth: Jul 19, 1950  Encounter Date: 03/12/2014      PT End of Session - 03/12/14 1516    Visit Number 5   Number of Visits 8   Date for PT Re-Evaluation 03/13/14   PT Start Time 9371   PT Stop Time 1515   PT Time Calculation (min) 32 min      Past Medical History  Diagnosis Date  . Abnormal uterine bleeding   . 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 Hyperlipidemia     Past Surgical History  Procedure Laterality Date  . Ankle surgery  2001    Right  . Diagnostic laparoscopy  1989  . Abdominal hysterectomy  10/2009    TAH, BSO, bilateral pelvic and periaortic lymph node dissection    There were no vitals taken for this visit.  Visit Diagnosis:  Lymphedema of both lower extremities      Subjective Assessment - 03/12/14 1450    Symptoms Havent been consistent with care at home due to holidays but can tell a positive difference in my swelling when I do. Finished the video and that helped as well.     Manual lymph drainage in supine: short neck, left axillary nodes, superficial and deep abdominals, left inguino-axillary anastamoses, left lower extremity from toes and dorsal foot to lateral hip redirecting along pathway.        LYMPHEDEMA/ONCOLOGY QUESTIONNAIRE - 03/12/14 1458    Left Lower Extremity Lymphedema   10 cm Proximal to Suprapatella 45 cm   At Midpatella/Popliteal Crease 37.2 cm   30 cm Proximal to Floor at Lateral Plantar Foot 36.5 cm   20 cm Proximal to Floor at Lateral Plantar Foot 29.3 cm   10 cm Proximal to Floor at Lateral Malleoli 25.4 cm   5 cm Proximal to Floor to 1st MTP Joint 20.6 cm   Across MTP Joint 20.9 cm   Around Proximal Great Toe 7.4 cm                          Short Term Clinic Goals - 01/29/14 1450    CC Short Term Goal  #1   Title short term goals= long term goals             Long Term Clinic Goals - 03/12/14 1518    CC Long Term Goal  #2   Title pt will be able to perform self manual lymph drainage   Time 6   Period Weeks   Status Achieved   CC Long Term Goal  #3   Title pt will verbalize use of compression for edema management   Time 6   Period Weeks   Status Achieved   CC Long Term Goal  #4   Title pt will be independent in a home exercise program for lymphedema management   Time 6   Period Weeks   Status Achieved            Plan - 03/12/14 1516    Clinical Impression Statement Pt has good understanding of Maintenance Phase of treatment and reports is ready to discharge to that, also due to insurance changing at first of year.   Pt will benefit from skilled therapeutic intervention in order to improve  on the following deficits Increased edema;Decreased skin integrity   Rehab Potential Excellent   PT Frequency 2x / week   PT Duration 4 weeks   PT Treatment/Interventions Manual lymph drainage;Manual techniques;Therapeutic exercise;Patient/family education   PT Next Visit Plan Discharge on visit.   Consulted and Agree with Plan of Care Patient        Problem List Patient Active Problem List   Diagnosis Date Noted  . Endometrial cancer 11/17/2011    Otelia Limes, PTA 03/12/2014, 3:19 PM  [place], [place], [address] Phone: [phone]   Fax:  [phone]   PHYSICAL THERAPY DISCHARGE SUMMARY  Visits from Start of Care: 5  Current functional level related to goals / functional outcomes: Pt has learned what she needs to do to continue management of her lymphedema   Remaining deficits: Lower extremity lymphedema   Education / Equipment: Self manual lymph drainage, exercise, how to get and use compression  garments Plan: Patient agrees to discharge.  Patient goals were partially met. Patient is being discharged due to meeting the stated rehab goals.  ?????        Donato Heinz. Owens Shark, PT 04/05/2014

## 2014-09-28 IMAGING — MG MM DIGITAL SCREENING BILAT W/ CAD
1 series · 4 of 4 positions shown · non-contrast
Comparison: Previous exam(s).

CLINICAL DATA: Screening.

EXAM:
DIGITAL SCREENING BILATERAL MAMMOGRAM WITH CAD

[R CC · right · 4 of 4 slices shown]
[im 1/4]
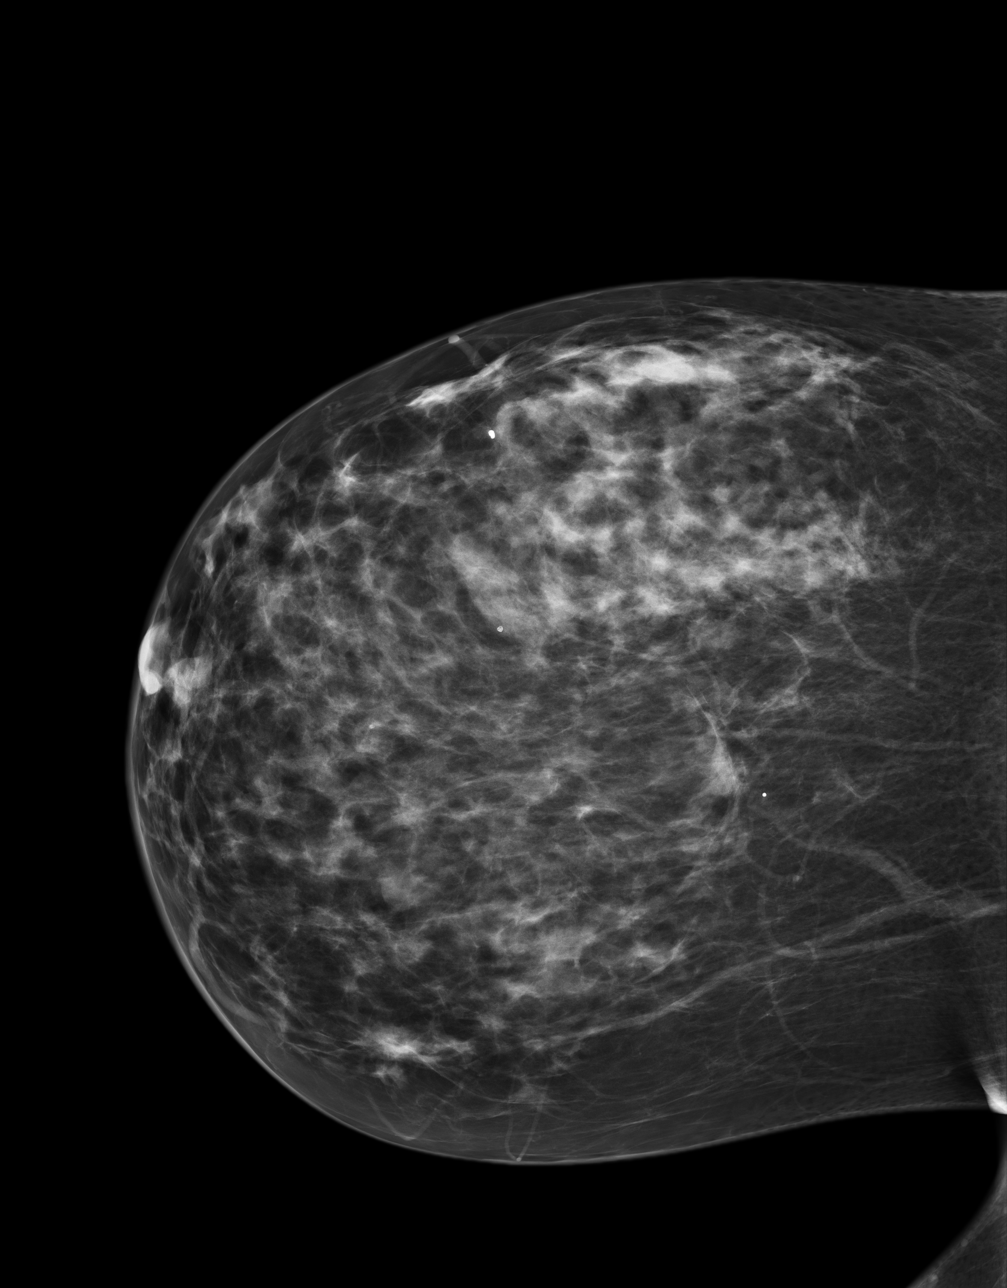
[im 2/4]
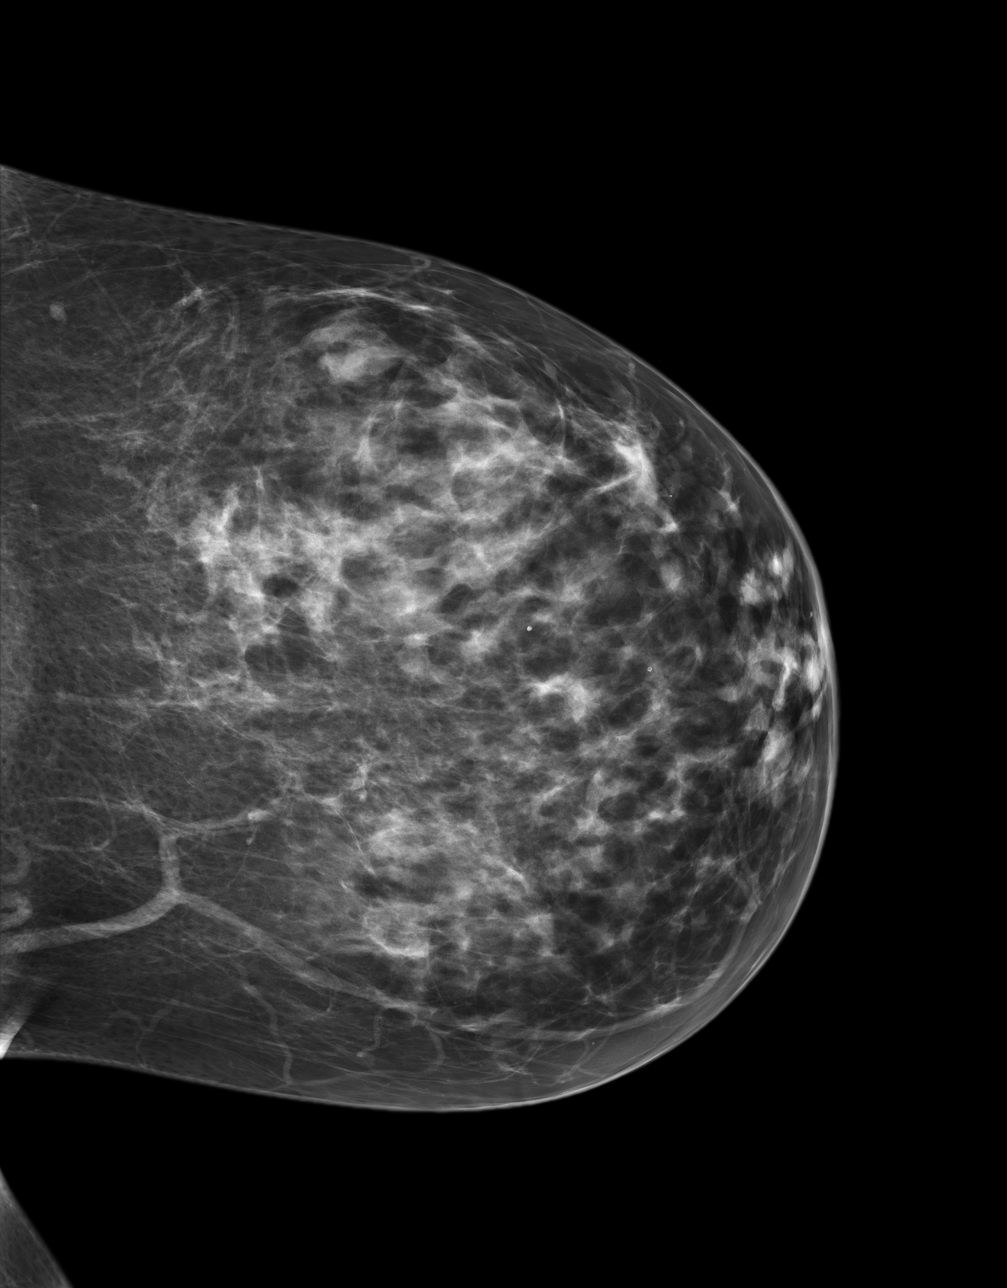
[im 3/4]
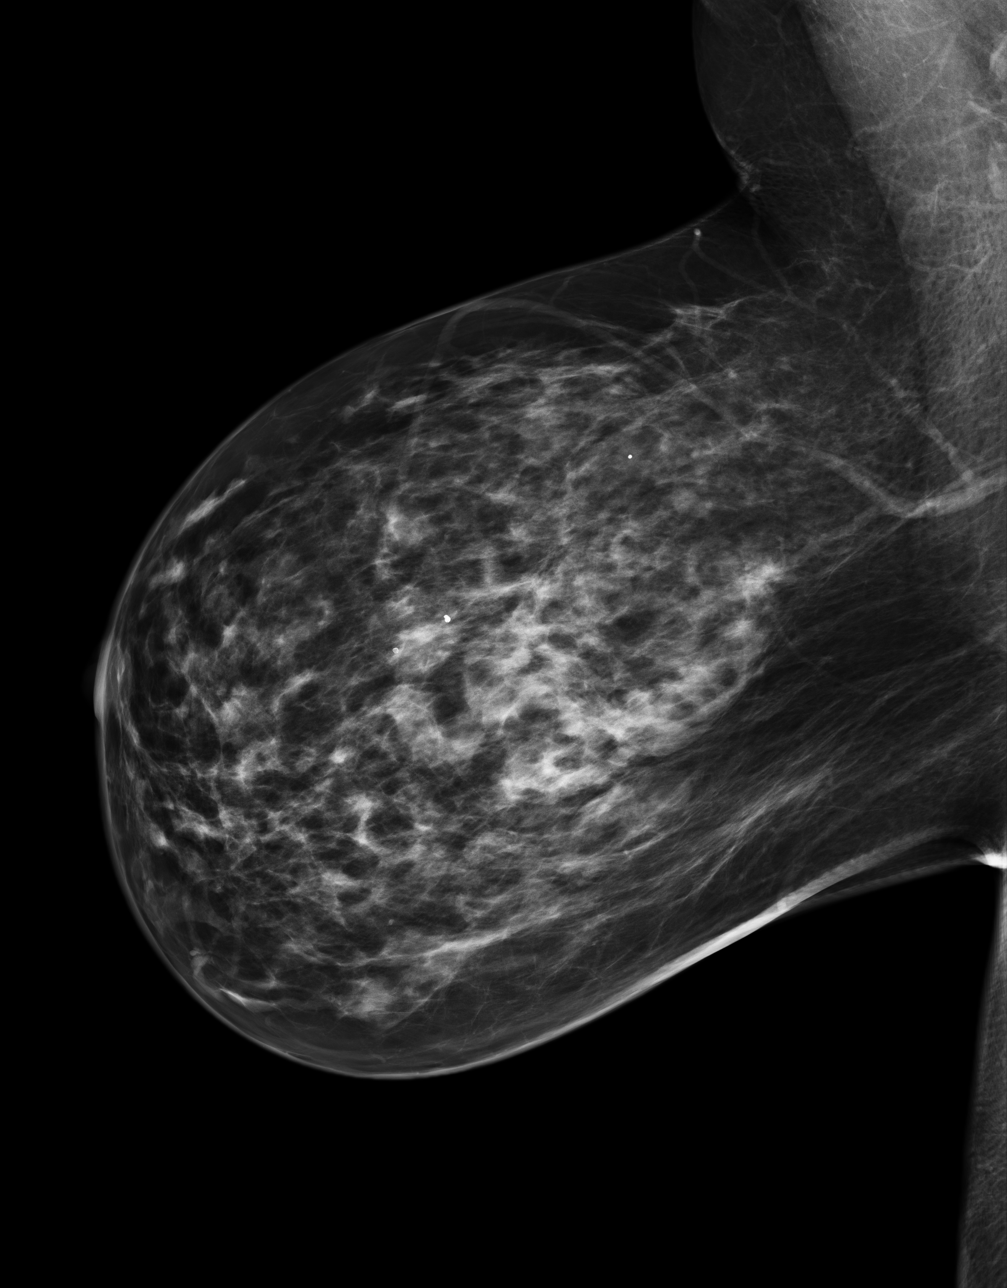
[im 4/4]
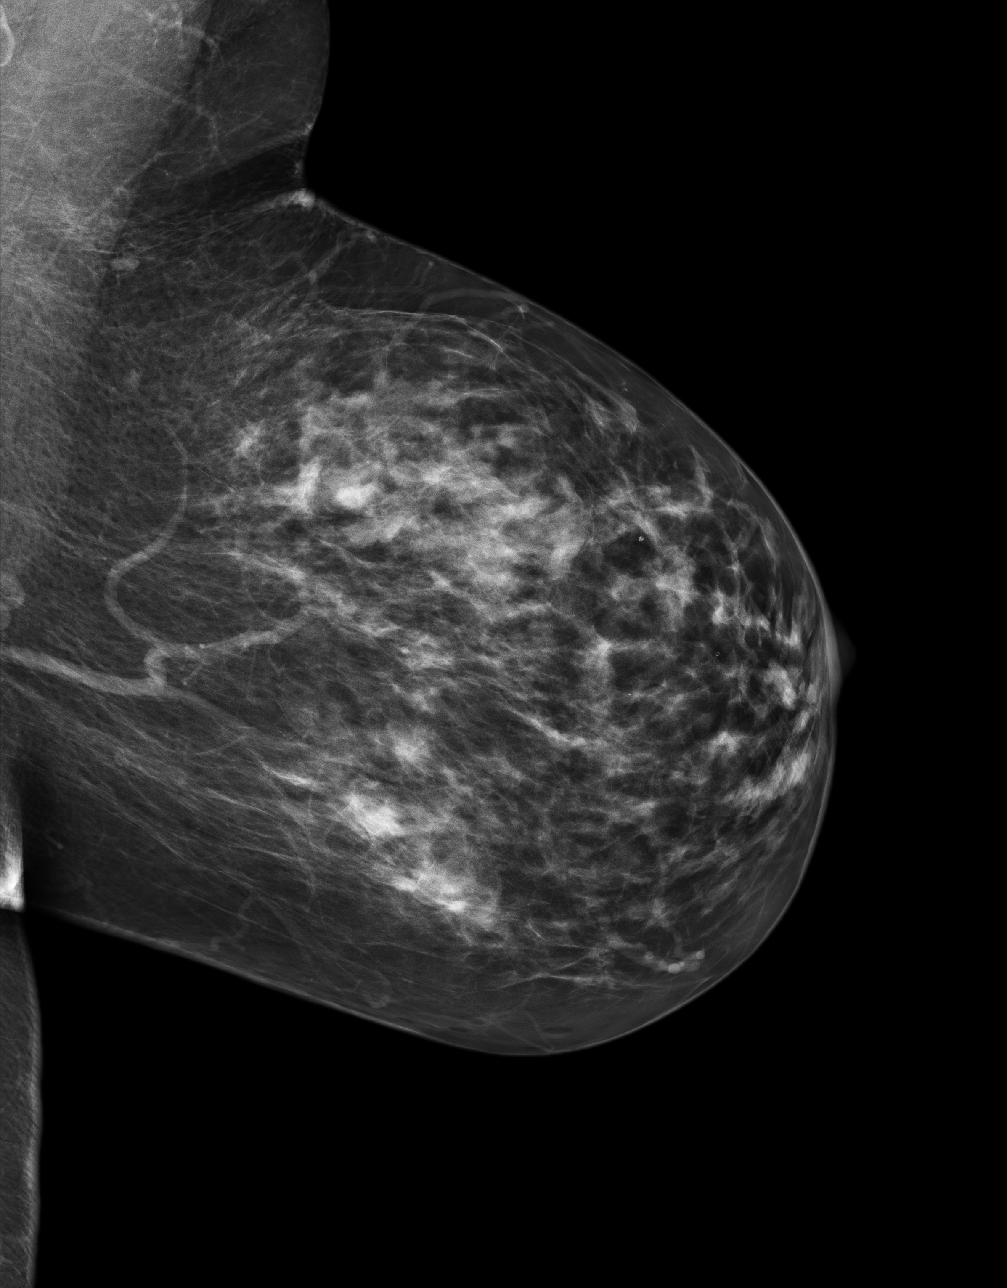

[4 of 4 positions shown; findings below may reference images not displayed]

ACR Breast Density Category c: The breasts are heterogeneously
dense, which may obscure small masses.
FINDINGS: There are no findings suspicious for malignancy. Images were
processed with CAD.
IMPRESSION: No mammographic evidence of malignancy. A result letter of this
screening mammogram will be mailed directly to the patient.

RECOMMENDATION:
Screening mammogram in one year. (Code:[id number]-B-[id number])

BI-RADS CATEGORY  1: Negative

## 2019-05-06 ENCOUNTER — Ambulatory Visit: Payer: Medicare Other | Attending: Internal Medicine

## 2019-05-06 DIAGNOSIS — Z23 Encounter for immunization: Secondary | ICD-10-CM | POA: Insufficient documentation

## 2019-05-06 NOTE — Progress Notes (Signed)
   Covid-19 Vaccination Clinic  Name:  Jennifer [name]    MRN: [id number] DOB: [birth year]  05/06/2019  [name] was observed post Covid-19 immunization for 15 minutes without incidence. She was provided with Vaccine Information Sheet and instruction to access the V-Safe system.   [name] was instructed to call 911 with any severe reactions post vaccine: Marland Kitchen Difficulty breathing  . Swelling of your face and throat  . A fast heartbeat  . A bad rash all over your body  . Dizziness and weakness    Immunizations Administered    Name Date Dose VIS Date Route   Moderna COVID-19 Vaccine 05/06/2019 11:29 AM 0.5 mL 02/12/2019 Intramuscular   Manufacturer: Moderna   Lot: [id number]   [id number]

## 2019-06-04 ENCOUNTER — Ambulatory Visit: Payer: Medicare Other | Attending: Internal Medicine

## 2019-06-04 DIAGNOSIS — Z23 Encounter for immunization: Secondary | ICD-10-CM

## 2019-06-04 NOTE — Progress Notes (Signed)
   Covid-19 Vaccination Clinic  Name:  Jennifer [name]    MRN: [id number] DOB: 1950-10-15  06/04/2019  [name] was observed post Covid-19 immunization for 15 minutes without incident. She was provided with Vaccine Information Sheet and instruction to access the V-Safe system.   [name] was instructed to call 911 with any severe reactions post vaccine: Marland Kitchen Difficulty breathing  . Swelling of face and throat  . A fast heartbeat  . A bad rash all over body  . Dizziness and weakness   Immunizations Administered    Name Date Dose VIS Date Route   Moderna COVID-19 Vaccine 06/04/2019  3:22 PM 0.5 mL 02/12/2019 Intramuscular   Manufacturer: Levan Hurst   LotUT:[id number]   [id number]

## 2020-03-23 ENCOUNTER — Ambulatory Visit: Payer: Medicare Other | Attending: Internal Medicine

## 2020-03-23 DIAGNOSIS — Z23 Encounter for immunization: Secondary | ICD-10-CM

## 2020-03-23 NOTE — Progress Notes (Signed)
   Covid-19 Vaccination Clinic  Name:  Jennyfer Nickolson    MRN: [id number] DOB: Jul 30, 1950  03/23/2020  Ms. Decoste was observed post Covid-19 immunization for 15 minutes without incident. She was provided with Vaccine Information Sheet and instruction to access the V-Safe system.   Ms. Rappa was instructed to call 911 with any severe reactions post vaccine: Marland Kitchen Difficulty breathing  . Swelling of face and throat  . A fast heartbeat  . A bad rash all over body  . Dizziness and weakness   Immunizations Administered    Name Date Dose VIS Date Route   Moderna Covid-19 Booster Vaccine 03/23/2020  3:46 PM 0.25 mL 01/01/2020 Intramuscular   Manufacturer: Levan Hurst   Lot: [id number]   [place]: [id number]

## 2023-08-12 ENCOUNTER — Other Ambulatory Visit: Payer: Self-pay

## 2023-08-12 ENCOUNTER — Emergency Department

## 2023-08-12 ENCOUNTER — Emergency Department
Admission: EM | Admit: 2023-08-12 | Discharge: 2023-08-12 | Disposition: A | Discharge status: Home / Self Care | Attending: Emergency Medicine | Admitting: Emergency Medicine

## 2023-08-12 DIAGNOSIS — Z8542 Personal history of malignant neoplasm of other parts of uterus: Secondary | ICD-10-CM | POA: Diagnosis not present

## 2023-08-12 DIAGNOSIS — S4291XA Fracture of right shoulder girdle, part unspecified, initial encounter for closed fracture: Secondary | ICD-10-CM

## 2023-08-12 DIAGNOSIS — W010XXA Fall on same level from slipping, tripping and stumbling without subsequent striking against object, initial encounter: Secondary | ICD-10-CM | POA: Insufficient documentation

## 2023-08-12 DIAGNOSIS — S42211A Unspecified displaced fracture of surgical neck of right humerus, initial encounter for closed fracture: Secondary | ICD-10-CM | POA: Diagnosis not present

## 2023-08-12 DIAGNOSIS — W19XXXA Unspecified fall, initial encounter: Secondary | ICD-10-CM

## 2023-08-12 DIAGNOSIS — M25512 Pain in left shoulder: Secondary | ICD-10-CM | POA: Diagnosis present

## 2023-08-12 DIAGNOSIS — I6782 Cerebral ischemia: Secondary | ICD-10-CM | POA: Insufficient documentation

## 2023-08-12 DIAGNOSIS — Y9222 Religious institution as the place of occurrence of the external cause: Secondary | ICD-10-CM | POA: Diagnosis not present

## 2023-08-12 MED ORDER — OXYCODONE-ACETAMINOPHEN 7.5-325 MG PO TABS: 1.0000 | ORAL_TABLET | ORAL | 0 refills | Status: AC | PRN

## 2023-08-12 MED ORDER — MELOXICAM 15 MG PO TABS: 15.0000 mg | ORAL_TABLET | Freq: Every day | ORAL | 0 refills | Status: AC

## 2023-08-12 MED ORDER — TRAMADOL HCL 50 MG PO TABS
50.0000 mg | ORAL_TABLET | Freq: Once | ORAL | Status: AC
  Administered 2023-08-12: 50 mg via ORAL
  Filled 2023-08-12: qty 1

## 2023-08-12 NOTE — ED Triage Notes (Signed)
 Pt comes in via pov after tripping and falling on a kneeler at church and landed on her right shoulder and arm. Pt complains of right arm pain of 7/10. Pt is able to slightly move her arm, but with pain.  Pt didn't hit her head, No LOC, and is not on blood thinners. Pt is alert and oriented x4 with no signs of acute distress at this time.

## 2023-08-12 NOTE — ED Notes (Signed)
Patient back in ED

## 2023-08-12 NOTE — Discharge Instructions (Signed)
 You have been diagnosed with right shoulder fracture (comminuted impacted fracture of the right humeral head and neck). Please keep your right arm in the sling.  Please take meloxicam every day with breakfast.  Please take Percocet 1 tablet by mouth every 6 hours as needed for pain.  If Percocet is making you drowsy you can take Percocet only at night.  Please come back to ED or go to your PCP if you have new symptoms or symptoms worsen.  Please call Dr. Basilio Both office and make an appointment for a follow-up with orthopedics.

## 2023-08-12 NOTE — ED Notes (Signed)
 Pt friend stated that the patient is confused and didn't remember the fall and now she doesn't remember getting the scans done today. Pt is currently GCS 15.Aaron AasAaron AasAaron AasA/Ox4.Aaron AasAaron AasStroke screen neg

## 2023-08-12 NOTE — ED Notes (Signed)
 Patient transported to CT

## 2023-08-12 NOTE — ED Provider Notes (Addendum)
 [facility] Provider Note    Event Date/Time   First MD Initiated Contact with Patient 08/12/23 1902     (approximate)   History   Fall    HPI  Jennifer [name] is a 73 y.o. female    with a past medical history of right wrist injury, uterine cancer, who presents to the ED complaining of left shoulder pain. According to the patient, she was at church and tripped and fell on her right shoulder.  Patient denies any head injury, loss of consciousness, blurry vision, nauseas or vomit.  Patient is not taking any blood thinners.      Physical Exam   Triage Vital Signs: ED Triage Vitals  Encounter Vitals Group     BP 08/12/23 1820 (!) 179/95     Systolic BP Percentile --      Diastolic BP Percentile --      Pulse Rate 08/12/23 1820 (!) 101     Resp 08/12/23 1820 17     Temp 08/12/23 1820 97.7 F (36.5 C)     Temp src --      SpO2 08/12/23 1820 97 %     Weight 08/12/23 1822 170 lb (77.1 kg)     Height 08/12/23 1822 5\' 2"  (1.575 m)     Head Circumference --      Peak Flow --      Pain Score 08/12/23 1821 8     Pain Loc --      Pain Education --      Exclude from Growth Chart --     Most recent vital signs: Vitals:   08/12/23 1820  BP: (!) 179/95  Pulse: (!) 101  Resp: 17  Temp: 97.7 F (36.5 C)  SpO2: 97%     Constitutional: Alert, NAD. Able to speak in complete sentences without cough or dyspnea  Eyes: Conjunctivae are normal.  Head: Atraumatic. Nose: No congestion/rhinnorhea. Mouth/Throat: Mucous membranes are moist.   Neck: Painless ROM. Supple. No JVD, nodes, thyromegaly  Cardiovascular:   Good peripheral circulation.RRR no murmurs, gallops, rubs  Respiratory: Normal respiratory effort.  No retractions. Clear to auscultation bilaterally without wheezing or crackles  Gastrointestinal: Soft and nontender.  Musculoskeletal: Right shoulder: Skin is intact, no ecchymosis or hematomas.  Deformity, full ROM limited by pain, pulses  present, sensation intact, strength 4/5. Neurologic:  MAE spontaneously. No gross focal neurologic deficits are appreciated.  Skin:  Skin is warm, dry and intact. No rash noted. Psychiatric: Mood and affect are normal. Speech and behavior are normal.    ED Results / Procedures / Treatments   Labs (all labs ordered are listed, but only abnormal results are displayed) Labs Reviewed - No data to display   EKG     RADIOLOGY I independently reviewed and interpreted imaging and agree with radiologists findings.      PROCEDURES:  Critical Care performed:   Procedures   MEDICATIONS ORDERED IN ED: Medications  traMADol (ULTRAM) tablet 50 mg (50 mg Oral Given 08/12/23 1936)   Clinical Course as of 08/12/23 2059  Sat Aug 12, 2023  1911 DG Shoulder Right Comminuted impacted fractures of the right humeral head and neck. [AE]  1951 Patient was ready for discharge, she was feeling confused and forgot that she had x-ray today.  I will order CT scan of the head [AE]  2057 No acute intracranial abnormality in a patient with chronic microvascular ischemic changes.   [AE]  2057 Received report of CT  scan ruling out intracranial abnormality.  Updated patient.  She is ready  for discharge [AE]    Clinical Course User Index [AE] Awilda Lennox, PA-C    IMPRESSION / MDM / ASSESSMENT AND PLAN / ED COURSE  I reviewed the triage vital signs and the nursing notes.  Differential diagnosis includes, but is not limited to, humeral fracture, shoulder dislocation, soft tissue injury  Patient's presentation is most consistent with acute complicated illness / injury requiring diagnostic workup.  Patient's diagnosis is consistent with comminuted impacted fractures of the right humeral head and neck. I independently reviewed and interpreted imaging and agree with radiologists findings.  Sickle exam is reassuring I did review the patient's allergies and medications.The patient is in stable and  satisfactory condition for discharge home.  Patient was placed on shoulder sling received tramadol for pain.  Before discharge patient was feeling confused ordered CT scan of the head and came back negative for intracranial pathology.  Patient will be discharged home with prescriptions for Percocet, meloxicam. Patient is to follow up with orthopedics as needed or otherwise directed. Patient is given ED precautions to return to the ED for any worsening or new symptoms. Discussed plan of care with patient, answered all of patient's questions, Patient agreeable to plan of care. Advised patient to take medications according to the instructions on the label. Discussed possible side effects of new medications. Patient verbalized understanding.    FINAL CLINICAL IMPRESSION(S) / ED DIAGNOSES   Final diagnoses:  Fall, initial encounter  Shoulder fracture, right, closed, initial encounter     Rx / DC Orders   ED Discharge Orders          Ordered    oxyCODONE-acetaminophen (PERCOCET) 7.5-325 MG tablet  Every 4 hours PRN        08/12/23 1938    meloxicam (MOBIC) 15 MG tablet  Daily        08/12/23 1938             Note:  This document was prepared using Dragon voice recognition software and may include unintentional dictation errors.   Awilda Lennox, PA-C 08/12/23 1942    Marylynn Soho, MD 08/12/23 Mertie Abt    Awilda Lennox, PA-C 08/12/23 2059    Marylynn Soho, MD 08/12/23 [phone]
